# Patient Record
Sex: Male | Born: 1995 | Race: White | Hispanic: No | Marital: Married | Smoking: Current some day smoker
Health system: Southern US, Community
[De-identification: ages and names within clinical notes are randomized; demographics above are authoritative.]

## PROBLEM LIST (undated history)

## (undated) DIAGNOSIS — R519 Headache, unspecified: Secondary | ICD-10-CM

## (undated) DIAGNOSIS — R51 Headache: Secondary | ICD-10-CM

## (undated) DIAGNOSIS — G44009 Cluster headache syndrome, unspecified, not intractable: Secondary | ICD-10-CM

## (undated) DIAGNOSIS — J351 Hypertrophy of tonsils: Secondary | ICD-10-CM

## (undated) DIAGNOSIS — Z87442 Personal history of urinary calculi: Secondary | ICD-10-CM

## (undated) DIAGNOSIS — J3501 Chronic tonsillitis: Secondary | ICD-10-CM

## (undated) DIAGNOSIS — K219 Gastro-esophageal reflux disease without esophagitis: Secondary | ICD-10-CM

## (undated) DIAGNOSIS — F909 Attention-deficit hyperactivity disorder, unspecified type: Secondary | ICD-10-CM

---

## 2014-03-08 ENCOUNTER — Ambulatory Visit: Payer: Self-pay | Admitting: Medical

## 2014-03-08 LAB — RAPID STREP-A WITH REFLX: MICRO TEXT REPORT: NEGATIVE

## 2014-03-11 LAB — BETA STREP CULTURE(ARMC)

## 2015-02-07 ENCOUNTER — Ambulatory Visit: Payer: Self-pay | Admitting: Unknown Physician Specialty

## 2015-06-18 NOTE — Discharge Instructions (Signed)
T & A INSTRUCTION SHEET - [facility] °[facility] ° °CREIGHTON VAUGHT, MD °PAUL H. JUENGEL, MD  °P. SCOTT BENNETT °CHAPMAN MCQUEEN, MD ° °[address] [place], [place] [address] TEL. [phone] °[address] [place] [address] [phone] ° °INFORMATION SHEET FOR A TONSILLECTOMY AND ADENDOIDECTOMY ° °About Your Tonsils and Adenoids ° The tonsils and adenoids are normal body tissues that are part of our immune system.  They normally help to protect us against diseases that may enter our mouth and nose.  However, sometimes the tonsils and/or adenoids become too large and obstruct our breathing, especially at night. °  ° If either of these things happen it helps to remove the tonsils and adenoids in order to become healthier. The operation to remove the tonsils and adenoids is called a tonsillectomy and adenoidectomy. ° °The Location of Your Tonsils and Adenoids ° The tonsils are located in the back of the throat on both side and sit in a cradle of muscles. The adenoids are located in the roof of the mouth, behind the nose, and closely associated with the opening of the Eustachian tube to the ear. ° °Surgery on Tonsils and Adenoids ° A tonsillectomy and adenoidectomy is a short operation which takes about thirty minutes.  This includes being put to sleep and being awakened.  Tonsillectomies and adenoidectomies are performed at [facility] and may require observation period in the recovery room prior to going home. ° °Following the Operation for a Tonsillectomy ° A cautery machine is used to control bleeding.  Bleeding from a tonsillectomy and adenoidectomy is minimal and postoperatively the risk of bleeding is approximately four percent, although this rarely life threatening. ° ° ° °After your tonsillectomy and adenoidectomy post-op care at home: ° °1. Our patients are able to go home the same day.  You may be given prescriptions for pain  medications and antibiotics, if indicated. °2. It is extremely important to remember that fluid intake is of utmost importance after a tonsillectomy.  The amount that you drink must be maintained in the postoperative period.  A good indication of whether a child is getting enough fluid is whether his/her urine output is constant.  As long as children are urinating or wetting their diaper every 6 - 8 hours this is usually enough fluid intake.   °3. Although rare, this is a risk of some bleeding in the first ten days after surgery.  This is usually occurs between day five and nine postoperatively.  This risk of bleeding is approximately four percent.  If you or your child should have any bleeding you should remain calm and notify our office or go directly to the Emergency Room at [facility] where they will contact us. Our doctors are available seven days a week for notification.  We recommend sitting up quietly in a chair, place an ice pack on the front of the neck and spitting out the blood gently until we are able to contact you.  Adults should gargle gently with ice water and this may help stop the bleeding.  If the bleeding does not stop after a short time, i.e. 10 to 15 minutes, or seems to be increasing again, please contact us or go to the hospital.   °4. It is common for the pain to be worse at 5 - 7 days postoperatively.  This occurs because the “scab” is peeling off and the mucous membrane (skin of   the throat) is growing back where the tonsils were.   °5. It is common for a low-grade fever, less than 102, during the first week after a tonsillectomy and adenoidectomy.  It is usually due to not drinking enough liquids, and we suggest your use liquid Tylenol or the pain medicine with Tylenol prescribed in order to keep your temperature below 102.  Please follow the directions on the back of the bottle. °6. Do not take aspirin or any products that contain aspirin such as Bufferin, Anacin,  Ecotrin, aspirin gum, Goodies, BC headache powders, etc., after a T&A because it can promote bleeding.  Please check with our office before administering any other medication that may been prescribed by other doctors during the two week post-operative period. °7. If you happen to look in the mirror or into your child’s mouth you will see white/gray patches on the back of the throat.  This is what a scab looks like in the mouth and is normal after having a T&A.  It will disappear once the tonsil area heals completely. However, it may cause a noticeable odor, and this too will disappear with time.  Warm salt water gargles may be used to keep the throat clean and promote healing.   °8. You or your child may experience ear pain after having a T&A.  This is called referred pain and comes from the throat, but it is felt in the ears.  Ear pain is quite common and expected.  It will usually go away after ten days.  There is usually nothing wrong with the ears, and it is primarily due to the healing area stimulating the nerve to the ear that runs along the side of the throat.  Use either the prescribed pain medicine or Tylenol as needed.  °The throat tissues after a tonsillectomy are obviously sensitive.  Smoking around children who have had a tonsillectomy significantly increases the risk of bleeding.  DO NOT SMOKE!  ° °General Anesthesia, Care After °Refer to this sheet in the next few weeks. These instructions provide you with information on caring for yourself after your procedure. Your health care provider may also give you more specific instructions. Your treatment has been planned according to current medical practices, but problems sometimes occur. Call your health care provider if you have any problems or questions after your procedure. °WHAT TO EXPECT AFTER THE PROCEDURE °After the procedure, it is typical to experience: °· Sleepiness. °· Nausea and vomiting. °HOME CARE INSTRUCTIONS °· For the first 24 hours after  general anesthesia: °¨ Have a responsible person with you. °¨ Do not drive a car. If you are alone, do not take public transportation. °¨ Do not drink alcohol. °¨ Do not take medicine that has not been prescribed by your health care provider. °¨ Do not sign important papers or make important decisions. °¨ You may resume a normal diet and activities as directed by your health care provider. °· Change bandages (dressings) as directed. °· If you have questions or problems that seem related to general anesthesia, call the hospital and ask for the anesthetist or anesthesiologist on call. °SEEK MEDICAL CARE IF: °· You have nausea and vomiting that continue the day after anesthesia. °· You develop a rash. °SEEK IMMEDIATE MEDICAL CARE IF:  °· You have difficulty breathing. °· You have chest pain. °· You have any allergic problems. °Document Released: 02/28/2001 Document Revised: 11/27/2013 Document Reviewed: 06/07/2013 °ExitCare® Patient Information ©2015 [employer]. This information is not intended to replace   advice given to you by your health care provider. Make sure you discuss any questions you have with your health care provider. ° °

## 2015-06-20 ENCOUNTER — Ambulatory Visit
Admission: RE | Admit: 2015-06-20 | Discharge: 2015-06-20 | Disposition: A | Discharge status: Home / Self Care | Payer: BLUE CROSS/BLUE SHIELD | Source: Ambulatory Visit | Attending: Unknown Physician Specialty | Admitting: Unknown Physician Specialty

## 2015-06-20 ENCOUNTER — Encounter
Admission: RE | Disposition: A | Discharge status: Home / Self Care | Payer: Self-pay | Source: Ambulatory Visit | Attending: Unknown Physician Specialty

## 2015-06-20 ENCOUNTER — Ambulatory Visit: Payer: BLUE CROSS/BLUE SHIELD | Admitting: Anesthesiology

## 2015-06-20 ENCOUNTER — Encounter: Payer: Self-pay | Admitting: *Deleted

## 2015-06-20 DIAGNOSIS — Z841 Family history of disorders of kidney and ureter: Secondary | ICD-10-CM | POA: Insufficient documentation

## 2015-06-20 DIAGNOSIS — J351 Hypertrophy of tonsils: Secondary | ICD-10-CM | POA: Diagnosis present

## 2015-06-20 DIAGNOSIS — Z8489 Family history of other specified conditions: Secondary | ICD-10-CM | POA: Insufficient documentation

## 2015-06-20 DIAGNOSIS — Z8249 Family history of ischemic heart disease and other diseases of the circulatory system: Secondary | ICD-10-CM | POA: Diagnosis not present

## 2015-06-20 DIAGNOSIS — J3501 Chronic tonsillitis: Secondary | ICD-10-CM | POA: Insufficient documentation

## 2015-06-20 DIAGNOSIS — F17200 Nicotine dependence, unspecified, uncomplicated: Secondary | ICD-10-CM | POA: Insufficient documentation

## 2015-06-20 HISTORY — DX: Chronic tonsillitis: J35.01

## 2015-06-20 HISTORY — DX: Headache, unspecified: R51.9

## 2015-06-20 HISTORY — DX: Hypertrophy of tonsils: J35.1

## 2015-06-20 HISTORY — DX: Cluster headache syndrome, unspecified, not intractable: G44.009

## 2015-06-20 HISTORY — PX: TONSILLECTOMY AND ADENOIDECTOMY: SHX28

## 2015-06-20 HISTORY — DX: Headache: R51

## 2015-06-20 SURGERY — TONSILLECTOMY AND ADENOIDECTOMY
Anesthesia: General | Wound class: Clean Contaminated

## 2015-06-20 MED ORDER — PROPOFOL 10 MG/ML IV BOLUS
INTRAVENOUS | Status: DC | PRN
  Administered 2015-06-20: 200 mg via INTRAVENOUS

## 2015-06-20 MED ORDER — DEXAMETHASONE SODIUM PHOSPHATE 4 MG/ML IJ SOLN
INTRAMUSCULAR | Status: DC | PRN
  Administered 2015-06-20: 4 mg via INTRAVENOUS

## 2015-06-20 MED ORDER — ONDANSETRON HCL 4 MG/2ML IJ SOLN
INTRAMUSCULAR | Status: DC | PRN
  Administered 2015-06-20: 4 mg via INTRAVENOUS

## 2015-06-20 MED ORDER — LIDOCAINE HCL (CARDIAC) 20 MG/ML IV SOLN
INTRAVENOUS | Status: DC | PRN
  Administered 2015-06-20: 10 mg via INTRAVENOUS

## 2015-06-20 MED ORDER — LACTATED RINGERS IV SOLN
INTRAVENOUS | Status: DC
  Administered 2015-06-20: 10:00:00 via INTRAVENOUS

## 2015-06-20 MED ORDER — FENTANYL CITRATE (PF) 100 MCG/2ML IJ SOLN
25.0000 ug | INTRAMUSCULAR | Status: DC | PRN
  Administered 2015-06-20 (×2): 25 ug via INTRAVENOUS

## 2015-06-20 MED ORDER — ACETAMINOPHEN 10 MG/ML IV SOLN
1000.0000 mg | Freq: Once | INTRAVENOUS | Status: AC
  Administered 2015-06-20: 1000 mg via INTRAVENOUS

## 2015-06-20 MED ORDER — BUPIVACAINE HCL (PF) 0.5 % IJ SOLN
INTRAMUSCULAR | Status: DC | PRN
  Administered 2015-06-20: 9 mL

## 2015-06-20 MED ORDER — OXYCODONE HCL 5 MG PO TABS: 5.0000 mg | ORAL_TABLET | Freq: Once | ORAL | Status: AC | PRN

## 2015-06-20 MED ORDER — ONDANSETRON HCL 4 MG/2ML IJ SOLN: 4.0000 mg | Freq: Once | INTRAMUSCULAR | Status: DC | PRN

## 2015-06-20 MED ORDER — FENTANYL CITRATE (PF) 100 MCG/2ML IJ SOLN
INTRAMUSCULAR | Status: DC | PRN
  Administered 2015-06-20: 50 ug via INTRAVENOUS
  Administered 2015-06-20: 100 ug via INTRAVENOUS

## 2015-06-20 MED ORDER — GLYCOPYRROLATE 0.2 MG/ML IJ SOLN
INTRAMUSCULAR | Status: DC | PRN
  Administered 2015-06-20: 0.1 mg via INTRAVENOUS

## 2015-06-20 MED ORDER — MIDAZOLAM HCL 5 MG/5ML IJ SOLN
INTRAMUSCULAR | Status: DC | PRN
  Administered 2015-06-20: 2 mg via INTRAVENOUS

## 2015-06-20 MED ORDER — OXYCODONE HCL 5 MG/5ML PO SOLN
5.0000 mg | Freq: Once | ORAL | Status: AC | PRN
  Administered 2015-06-20: 5 mg via ORAL

## 2015-06-20 SURGICAL SUPPLY — 20 items
CANISTER SUCT 1200ML W/VALVE (MISCELLANEOUS) ×2 IMPLANT
CATH RUBBER RED 8F (CATHETERS) ×2 IMPLANT
COAG SUCT 10F 3.5MM HAND CTRL (MISCELLANEOUS) ×2 IMPLANT
DRAPE HEAD BAR (DRAPES) ×2 IMPLANT
ELECT CAUTERY BLADE TIP 2.5 (TIP) ×2
ELECTRODE CAUTERY BLDE TIP 2.5 (TIP) ×1 IMPLANT
GLOVE BIO SURGEON STRL SZ7.5 (GLOVE) ×2 IMPLANT
HANDLE SUCTION POOLE (INSTRUMENTS) ×1 IMPLANT
NEEDLE HYPO 25GX1X1/2 BEV (NEEDLE) ×2 IMPLANT
NS IRRIG 500ML POUR BTL (IV SOLUTION) ×2 IMPLANT
PACK TONSIL/ADENOIDS (PACKS) ×2 IMPLANT
PAD GROUND ADULT SPLIT (MISCELLANEOUS) ×2 IMPLANT
PENCIL ELECTRO HAND CTR (MISCELLANEOUS) ×2 IMPLANT
SOL ANTI-FOG 6CC FOG-OUT (MISCELLANEOUS) ×1 IMPLANT
SOL FOG-OUT ANTI-FOG 6CC (MISCELLANEOUS) ×1
SPONGE TONSIL 7/8 RF SGL LF (GAUZE/BANDAGES/DRESSINGS) ×2 IMPLANT
STRAP BODY AND KNEE 60X3 (MISCELLANEOUS) ×2 IMPLANT
SUCTION POOLE HANDLE (INSTRUMENTS) ×2
SYR 5ML LL (SYRINGE) ×2 IMPLANT
SYRINGE 10CC LL (SYRINGE) IMPLANT

## 2015-06-20 NOTE — H&P (Signed)
  H+P  Reviewed and will be scanned in later. No changes noted. 

## 2015-06-20 NOTE — Anesthesia Procedure Notes (Signed)
Procedure Name: Intubation Date/Time: 06/20/2015 11:02 AM Performed by: Andee PolesBUSH, Jaylee Lantry Pre-anesthesia Checklist: Patient identified, Emergency Drugs available, Suction available, Patient being monitored and Timeout performed Patient Re-evaluated:Patient Re-evaluated prior to inductionOxygen Delivery Method: Circle system utilized Preoxygenation: Pre-oxygenation with 100% oxygen Intubation Type: IV induction Ventilation: Mask ventilation without difficulty Grade View: Grade I Tube type: Oral Rae Tube size: 7.5 mm Number of attempts: 1 Placement Confirmation: ETT inserted through vocal cords under direct vision,  positive ETCO2 and breath sounds checked- equal and bilateral Tube secured with: Tape Dental Injury: Teeth and Oropharynx as per pre-operative assessment

## 2015-06-20 NOTE — Transfer of Care (Signed)
Immediate Anesthesia Transfer of Care Note  Patient: Matthew Bird  Procedure(s) Performed: Procedure(s): TONSILLECTOMY AND ADENOIDECTOMY (N/A)  Patient Location: PACU  Anesthesia Type: General ETT  Level of Consciousness: awake, alert  and patient cooperative  Airway and Oxygen Therapy: Patient Spontanous Breathing and Patient connected to supplemental oxygen  Post-op Assessment: Post-op Vital signs reviewed, Patient's Cardiovascular Status Stable, Respiratory Function Stable, Patent Airway and No signs of Nausea or vomiting  Post-op Vital Signs: Reviewed and stable  Complications: No apparent anesthesia complications

## 2015-06-20 NOTE — Op Note (Signed)
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  SURGEON:  Davina Pokehapman T. Flynn Gwyn, MD  ANESTHESIA:  General endotracheal.  OPERATIVE FINDINGS:  Large tonsils and adenoids.  DESCRIPTION OF THE PROCEDURE:  Matthew Bird was identified in the holding area and taken to the operating room and placed in the supine position.  After general endotracheal anesthesia, the table was turned 45 degrees and the patient was draped in the usual fashion for a tonsillectomy.  A mouth gag was inserted into the oral cavity and examination of the oropharynx showed the uvula was non-bifid.  There was no evidence of submucous cleft to the palate.  There were large tonsils.  A red rubber catheter was placed through the nostril.  Examination of the nasopharynx showed large obstructing adenoids.  Under indirect vision with the mirror, an adenotome was placed in the nasopharynx.  The adenoids were curetted free.  Reinspection with a mirror showed excellent removal of the adenoid.  Nasopharyngeal packs were then placed.  The operation then turned to the tonsillectomy.  Beginning on the left-hand side a tenaculum was used to grasp the tonsil and the Bovie cautery was used to dissect it free from the fossa.  In a similar fashion, the right tonsil was removed.  Meticulous hemostasis was achieved using the Bovie cautery.  With both tonsils removed and no active bleeding, the nasopharyngeal packs were removed.  Suction cautery was then used to cauterize the nasopharyngeal bed to prevent bleeding.  The red rubber catheter was removed with no active bleeding.  0.5% plain Marcaine was used to inject the anterior and posterior tonsillar pillars bilaterally.  A total of 9 mls was used.  The patient tolerated the procedure well and was awakened in the operating room and taken to the recovery room in stable condition.   CULTURES:   None.  SPECIMENS:  Tonsils and adenoids.  ESTIMATED BLOOD LOSS:  Less than 20 ml.  [name] T  06/20/2015  11:20 AM

## 2015-06-20 NOTE — Anesthesia Preprocedure Evaluation (Signed)
Anesthesia Evaluation  Patient identified by MRN, date of birth, ID band  Reviewed: Allergy & Precautions, H&P , NPO status , Patient's Chart, lab work & pertinent test results  Airway Mallampati: I  TM Distance: >3 FB Neck ROM: full    Dental no notable dental hx.    Pulmonary former smoker,    Pulmonary exam normal       Cardiovascular Rhythm:regular Rate:Normal     Neuro/Psych  Headaches,    GI/Hepatic   Endo/Other    Renal/GU      Musculoskeletal   Abdominal   Peds  Hematology   Anesthesia Other Findings   Reproductive/Obstetrics                             Anesthesia Physical Anesthesia Plan  ASA: II  Anesthesia Plan: General ETT   Post-op Pain Management:    Induction:   Airway Management Planned:   Additional Equipment:   Intra-op Plan:   Post-operative Plan:   Informed Consent: I have reviewed the patients History and Physical, chart, labs and discussed the procedure including the risks, benefits and alternatives for the proposed anesthesia with the patient or authorized representative who has indicated his/her understanding and acceptance.     Plan Discussed with: CRNA  Anesthesia Plan Comments:         Anesthesia Quick Evaluation

## 2015-06-20 NOTE — Anesthesia Postprocedure Evaluation (Signed)
  Anesthesia Post-op Note  Patient: Matthew Bird  Procedure(s) Performed: Procedure(s): TONSILLECTOMY AND ADENOIDECTOMY (N/A)  Anesthesia type:General ETT  Patient location: PACU  Post pain: Pain level controlled  Post assessment: Post-op Vital signs reviewed, Patient's Cardiovascular Status Stable, Respiratory Function Stable, Patent Airway and No signs of Nausea or vomiting  Post vital signs: Reviewed and stable  Last Vitals:  Filed Vitals:   06/20/15 1200  BP: 117/80  Pulse: 82  Temp:   Resp: 22    Level of consciousness: awake, alert  and patient cooperative  Complications: No apparent anesthesia complications

## 2015-06-24 LAB — SURGICAL PATHOLOGY

## 2016-12-06 DIAGNOSIS — I499 Cardiac arrhythmia, unspecified: Secondary | ICD-10-CM

## 2016-12-06 HISTORY — DX: Cardiac arrhythmia, unspecified: I49.9

## 2017-04-10 ENCOUNTER — Emergency Department: Payer: BLUE CROSS/BLUE SHIELD

## 2017-04-10 ENCOUNTER — Emergency Department
Admission: EM | Admit: 2017-04-10 | Discharge: 2017-04-11 | Disposition: A | Discharge status: Home / Self Care | Payer: BLUE CROSS/BLUE SHIELD | Attending: Emergency Medicine | Admitting: Emergency Medicine

## 2017-04-10 ENCOUNTER — Encounter: Payer: Self-pay | Admitting: Emergency Medicine

## 2017-04-10 DIAGNOSIS — I471 Supraventricular tachycardia: Secondary | ICD-10-CM | POA: Insufficient documentation

## 2017-04-10 DIAGNOSIS — Z79899 Other long term (current) drug therapy: Secondary | ICD-10-CM | POA: Diagnosis not present

## 2017-04-10 DIAGNOSIS — R000 Tachycardia, unspecified: Secondary | ICD-10-CM | POA: Diagnosis present

## 2017-04-10 DIAGNOSIS — F17290 Nicotine dependence, other tobacco product, uncomplicated: Secondary | ICD-10-CM | POA: Diagnosis not present

## 2017-04-10 LAB — CBC
HEMATOCRIT: 45.6 % (ref 40.0–52.0)
HEMOGLOBIN: 16 g/dL (ref 13.0–18.0)
MCH: 30.2 pg (ref 26.0–34.0)
MCHC: 35 g/dL (ref 32.0–36.0)
MCV: 86.3 fL (ref 80.0–100.0)
Platelets: 318 10*3/uL (ref 150–440)
RBC: 5.29 MIL/uL (ref 4.40–5.90)
RDW: 12.1 % (ref 11.5–14.5)
WBC: 10 10*3/uL (ref 3.8–10.6)

## 2017-04-10 MED ORDER — SODIUM CHLORIDE 0.9 % IV BOLUS (SEPSIS)
1000.0000 mL | Freq: Once | INTRAVENOUS | Status: AC
  Administered 2017-04-10: 1000 mL via INTRAVENOUS

## 2017-04-10 MED ORDER — ADENOSINE 6 MG/2ML IV SOLN
6.0000 mg | Freq: Once | INTRAVENOUS | Status: AC
  Administered 2017-04-10: 6 mg via INTRAVENOUS

## 2017-04-10 NOTE — ED Notes (Signed)
ED Provider at bedside. Patient given straw and asked to blow as forcefully as possible.

## 2017-04-11 LAB — COMPREHENSIVE METABOLIC PANEL
ALT: 20 U/L (ref 17–63)
ANION GAP: 12 (ref 5–15)
AST: 24 U/L (ref 15–41)
Albumin: 5 g/dL (ref 3.5–5.0)
Alkaline Phosphatase: 71 U/L (ref 38–126)
BUN: 16 mg/dL (ref 6–20)
CHLORIDE: 103 mmol/L (ref 101–111)
CO2: 25 mmol/L (ref 22–32)
Calcium: 9.9 mg/dL (ref 8.9–10.3)
Creatinine, Ser: 0.97 mg/dL (ref 0.61–1.24)
GFR calc non Af Amer: >60 mL/min (ref >60)
Glucose, Bld: 109 mg/dL — ABNORMAL HIGH (ref 65–99)
Potassium: 3.1 mmol/L — ABNORMAL LOW (ref 3.5–5.1)
SODIUM: 140 mmol/L (ref 135–145)
Total Bilirubin: 1.3 mg/dL — ABNORMAL HIGH (ref 0.3–1.2)
Total Protein: 7.8 g/dL (ref 6.5–8.1)

## 2017-04-11 LAB — TSH: TSH: 3.865 u[IU]/mL (ref 0.350–4.500)

## 2017-04-11 LAB — TROPONIN I: Troponin I: <0.03 ng/mL (ref <0.03)

## 2017-04-11 LAB — MAGNESIUM: MAGNESIUM: 2 mg/dL (ref 1.7–2.4)

## 2017-04-11 MED ORDER — ONDANSETRON HCL 4 MG/2ML IJ SOLN
4.0000 mg | Freq: Once | INTRAMUSCULAR | Status: AC
  Administered 2017-04-11: 4 mg via INTRAVENOUS
  Filled 2017-04-11: qty 2

## 2017-04-11 MED ORDER — POTASSIUM CHLORIDE CRYS ER 20 MEQ PO TBCR
40.0000 meq | EXTENDED_RELEASE_TABLET | Freq: Once | ORAL | Status: AC
  Administered 2017-04-11: 40 meq via ORAL
  Filled 2017-04-11: qty 2

## 2017-04-11 NOTE — ED Provider Notes (Signed)
[facility] Emergency Department Provider Note   ____________________________________________   First MD Initiated Contact with Patient 04/10/17 2335     (approximate)  I have reviewed the triage vital signs and the nursing notes.   HISTORY  Chief Complaint Tachycardia    HPI Matthew Bird is a 21 y.o. male who comes into the hospital with his heart racing. The patient reports this started around 9 PM. The patient had just eaten dinner and was running from his dad who was trying to grab his phone. He reports that his heart started going pretty fast and his heart rate was pretty high. The patient states that it never came down. He waited some time but did not do anything else for the palpitations. He denies any chest pain, shortness of breath, nausea or vomiting. The patient has never had his heart race like this in the past. He decided to come in for treatment and evaluation.   Past Medical History:  Diagnosis Date  . Cluster headache syndrome    WHEN HAVING CLUSTER HEADACHES 1-3/DAY  . Headache   . Tonsillar hypertrophy   . Tonsillitis, chronic     There are no active problems to display for this patient.   Past Surgical History:  Procedure Laterality Date  . TONSILLECTOMY AND ADENOIDECTOMY N/A 06/20/2015   Procedure: TONSILLECTOMY AND ADENOIDECTOMY;  Surgeon: Linus Salmons, MD;  Location: [facility];  Service: ENT;  Laterality: N/A;    Prior to Admission medications   Medication Sig Start Date End Date Taking? Authorizing Provider  Dexmethylphenidate HCl 25 MG CP24 Take 1 capsule by mouth daily.   Yes [provider]    Allergies Patient has no known allergies.  No family history on file.  Social History Social History  Substance Use Topics  . Smoking status: Current Some Day Smoker    Packs/day: 0.00    Types: Cigars  . Smokeless tobacco: Never Used  . Alcohol use Yes    Review of  Systems  Constitutional: No fever/chills Eyes: No visual changes. ENT: No sore throat. Cardiovascular: Palpitations Respiratory: Denies shortness of breath. Gastrointestinal: No abdominal pain.  No nausea, no vomiting.  No diarrhea.  No constipation. Genitourinary: Negative for dysuria. Musculoskeletal: Negative for back pain. Skin: Negative for rash. Neurological: Negative for headaches, focal weakness or numbness.   ____________________________________________   PHYSICAL EXAM:  VITAL SIGNS: ED Triage Vitals  Enc Vitals Group     BP 04/10/17 2341 (!) 133/96     Pulse Rate 04/10/17 2341 (!) 151     Resp 04/10/17 2341 17     Temp --      Temp src --      SpO2 04/10/17 2341 100 %     Weight 04/10/17 2331 165 lb (74.8 kg)     Height 04/10/17 2331 5\' 11"  (1.803 m)     Head Circumference --      Peak Flow --      Pain Score --      Pain Loc --      Pain Edu? --      Excl. in GC? --     Constitutional: Alert and oriented. Well appearing and in Mild distress. Eyes: Conjunctivae are normal. PERRL. EOMI. Head: Atraumatic. Nose: No congestion/rhinnorhea. Mouth/Throat: Mucous membranes are moist.  Oropharynx non-erythematous. Cardiovascular: Tachycardia, regular rhythm. Grossly normal heart sounds.  Good peripheral circulation. Respiratory: Normal respiratory effort.  No retractions. Lungs CTAB. Gastrointestinal: Soft and nontender. No distention. Positive  bowel sounds Musculoskeletal: No lower extremity tenderness nor edema.   Neurologic:  Normal speech and language.  Skin:  Skin is warm, dry and intact.  Psychiatric: Mood and affect are normal.   ____________________________________________   LABS (all labs ordered are listed, but only abnormal results are displayed)  Labs Reviewed  COMPREHENSIVE METABOLIC PANEL - Abnormal; Notable for the following:       Result Value   Potassium 3.1 (*)    Glucose, Bld 109 (*)    Total Bilirubin 1.3 (*)    All other components  within normal limits  CBC  TROPONIN I  MAGNESIUM  TSH  TROPONIN I   ____________________________________________  EKG  ED ECG REPORT #1 I, Rebecka Apley, the attending physician, personally viewed and interpreted this ECG.   Date: 04/10/2017  EKG Time: 2332  Rate: 169  Rhythm: SVT  Axis: normal  Intervals:none  ST&T Change: none  ED ECG REPORT #2 I, Rebecka Apley, the attending physician, personally viewed and interpreted this ECG.   Date: 04/10/2017  EKG Time: 2342  Rate: 121  Rhythm: sinus tachycardia  Axis: normal  Intervals:none  ST&T Change: none   ____________________________________________  RADIOLOGY  CXR ____________________________________________   PROCEDURES  Procedure(s) performed: None  Procedures  Critical Care performed: Yes, see critical care note(s)  .CRITICAL CARE Performed by: Lucrezia Europe P   Total critical care time: 30 minutes  Critical care time was exclusive of separately billable procedures and treating other patients.  Critical care was necessary to treat or prevent imminent or life-threatening deterioration.  Critical care was time spent personally by me on the following activities: development of treatment plan with patient and/or surrogate as well as nursing, discussions with consultants, evaluation of patient's response to treatment, examination of patient, obtaining history from patient or surrogate, ordering and performing treatments and interventions, ordering and review of laboratory studies, ordering and review of radiographic studies, pulse oximetry and re-evaluation of patient's condition.  ____________________________________________   INITIAL IMPRESSION / ASSESSMENT AND PLAN / ED COURSE  Pertinent labs & imaging results that were available during my care of the patient were reviewed by me and considered in my medical decision making (see chart for details).  This is a 21 year old who comes  into the hospital today with some tachycardia. The patient appeared to be in SVT. I did give the patient some adenosine 6 mg IV 1 dose and he did convert to sinus tachycardia and then to normal sinus rhythm. We did check the blood work on the patient and he has some mild hypokalemia but the remaining blood work is unremarkable. I will repeat the patient's troponin and if it is negative he will be discharged to follow-up with cardiology.  Clinical Course as of Apr 11 728  Mon Apr 11, 2017  0048 Normal chest. DG Chest 2 View [AW]    Clinical Course User Index [AW] Rebecka Apley, MD   The patient's repeat troponin is negative. I did give him some potassium chloride for his hypokalemia. He will be discharged home to follow-up with cardiology.  ____________________________________________   FINAL CLINICAL IMPRESSION(S) / ED DIAGNOSES  Final diagnoses:  SVT (supraventricular tachycardia) (HCC)      NEW MEDICATIONS STARTED DURING THIS VISIT:  Discharge Medication List as of 04/11/2017  3:25 AM       Note:  This document was prepared using Dragon voice recognition software and may include unintentional dictation errors.    Rebecka Apley,  MD 04/11/17 [id number]

## 2017-04-11 NOTE — ED Notes (Signed)
Nurse assessed pt's nausea and dimmed light and gave mother coffee.

## 2017-04-11 NOTE — Discharge Instructions (Signed)
Please avoid caffeine and drink lots of water to stay hydrated. Please return with any worsened sypmtoms and please follow up with cardiology

## 2017-04-11 NOTE — ED Notes (Addendum)
Pt states he had eaten dinner and then had been running at 9pm and states he felt like his heart was racing and it didn't slow down.

## 2018-07-26 ENCOUNTER — Other Ambulatory Visit: Payer: Self-pay | Admitting: Internal Medicine

## 2018-07-26 DIAGNOSIS — M5412 Radiculopathy, cervical region: Secondary | ICD-10-CM

## 2018-07-26 DIAGNOSIS — M5010 Cervical disc disorder with radiculopathy, unspecified cervical region: Secondary | ICD-10-CM | POA: Diagnosis not present

## 2018-07-26 DIAGNOSIS — Z0000 Encounter for general adult medical examination without abnormal findings: Secondary | ICD-10-CM | POA: Diagnosis not present

## 2018-08-04 ENCOUNTER — Ambulatory Visit: Payer: BLUE CROSS/BLUE SHIELD

## 2018-08-04 ENCOUNTER — Other Ambulatory Visit: Payer: BLUE CROSS/BLUE SHIELD

## 2018-08-29 DIAGNOSIS — M542 Cervicalgia: Secondary | ICD-10-CM | POA: Diagnosis not present

## 2018-10-09 ENCOUNTER — Other Ambulatory Visit: Payer: Self-pay | Admitting: Unknown Physician Specialty

## 2018-10-09 DIAGNOSIS — R51 Headache: Secondary | ICD-10-CM | POA: Diagnosis not present

## 2018-10-09 DIAGNOSIS — R519 Headache, unspecified: Secondary | ICD-10-CM

## 2018-10-09 DIAGNOSIS — G8929 Other chronic pain: Secondary | ICD-10-CM

## 2018-10-27 ENCOUNTER — Ambulatory Visit: Payer: 59

## 2019-08-21 ENCOUNTER — Ambulatory Visit
Admission: RE | Admit: 2019-08-21 | Discharge: 2019-08-21 | Disposition: A | Discharge status: Home / Self Care | Payer: 59 | Source: Ambulatory Visit | Attending: Internal Medicine | Admitting: Internal Medicine

## 2019-08-21 ENCOUNTER — Other Ambulatory Visit: Admission: RE | Admit: 2019-08-21 | Payer: 59 | Source: Ambulatory Visit

## 2019-08-21 ENCOUNTER — Encounter: Payer: Self-pay | Admitting: Anesthesiology

## 2019-08-21 ENCOUNTER — Other Ambulatory Visit: Payer: Self-pay

## 2019-08-21 ENCOUNTER — Encounter
Admission: AD | Disposition: A | Discharge status: Home / Self Care | Payer: Self-pay | Source: Ambulatory Visit | Attending: General Surgery

## 2019-08-21 ENCOUNTER — Ambulatory Visit: Payer: Self-pay | Admitting: General Surgery

## 2019-08-21 ENCOUNTER — Other Ambulatory Visit: Payer: Self-pay | Admitting: Internal Medicine

## 2019-08-21 ENCOUNTER — Observation Stay
Admission: AD | Admit: 2019-08-21 | Discharge: 2019-08-22 | Disposition: A | Discharge status: Home / Self Care | Payer: 59 | Source: Ambulatory Visit | Attending: General Surgery | Admitting: General Surgery

## 2019-08-21 DIAGNOSIS — K3580 Unspecified acute appendicitis: Secondary | ICD-10-CM | POA: Diagnosis present

## 2019-08-21 DIAGNOSIS — Z20828 Contact with and (suspected) exposure to other viral communicable diseases: Secondary | ICD-10-CM | POA: Diagnosis not present

## 2019-08-21 DIAGNOSIS — K3533 Acute appendicitis with perforation and localized peritonitis, with abscess: Secondary | ICD-10-CM | POA: Diagnosis not present

## 2019-08-21 DIAGNOSIS — R1031 Right lower quadrant pain: Secondary | ICD-10-CM | POA: Insufficient documentation

## 2019-08-21 DIAGNOSIS — K353 Acute appendicitis with localized peritonitis, without perforation or gangrene: Secondary | ICD-10-CM | POA: Diagnosis present

## 2019-08-21 DIAGNOSIS — F17290 Nicotine dependence, other tobacco product, uncomplicated: Secondary | ICD-10-CM | POA: Insufficient documentation

## 2019-08-21 DIAGNOSIS — Z23 Encounter for immunization: Secondary | ICD-10-CM | POA: Diagnosis not present

## 2019-08-21 HISTORY — PX: LAPAROSCOPIC APPENDECTOMY: SHX408

## 2019-08-21 LAB — SARS CORONAVIRUS 2 BY RT PCR (HOSPITAL ORDER, PERFORMED IN ~~LOC~~ HOSPITAL LAB): SARS Coronavirus 2: NEGATIVE

## 2019-08-21 LAB — MRSA PCR SCREENING: MRSA by PCR: NEGATIVE

## 2019-08-21 IMAGING — CT CT ABD-PELV W/ CM
2 of 4 series · 14 of 46 positions shown, 16 images · IV contrast (APPLIED)
Comparison: None.

CLINICAL DATA: Right lower quadrant pain

EXAM:
CT ABDOMEN AND PELVIS WITH CONTRAST
TECHNIQUE: Multidetector CT imaging of the abdomen and pelvis was performed
using the standard protocol following bolus administration of
intravenous contrast.
CONTRAST:  100mL OMNIPAQUE IOHEXOL 300 MG/ML  SOLN

[Series 2: routine abd/pel with · axial · 0.70mm/px · z∈[-522,-157]mm · 11 of 89 slices shown, 13 images]
[im 8/89  soft-tissue]
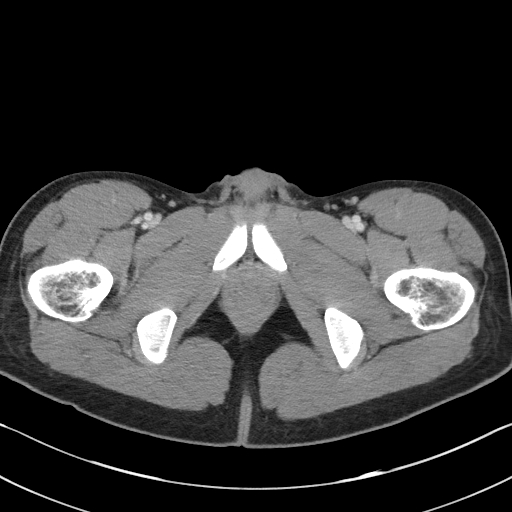
[im 8/89  bone]
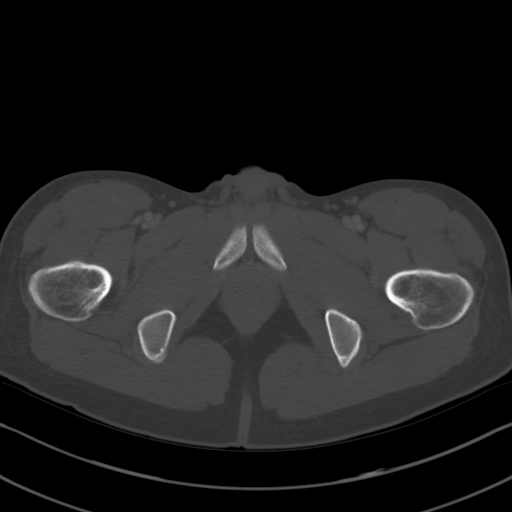
[im 15/89  soft-tissue]
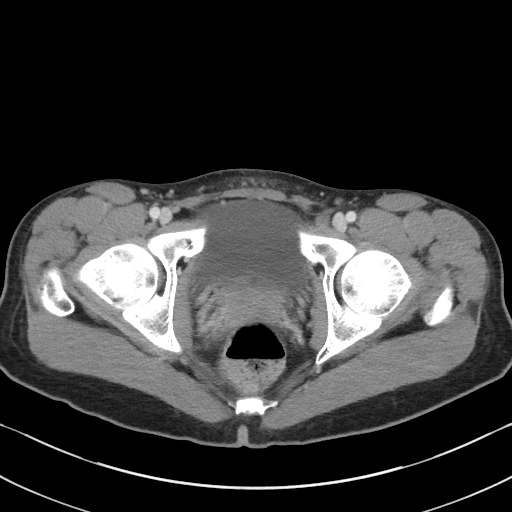
[im 23/89  soft-tissue]
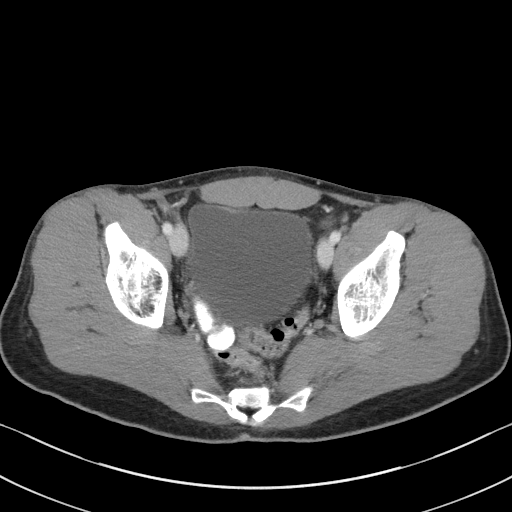
[im 30/89  soft-tissue]
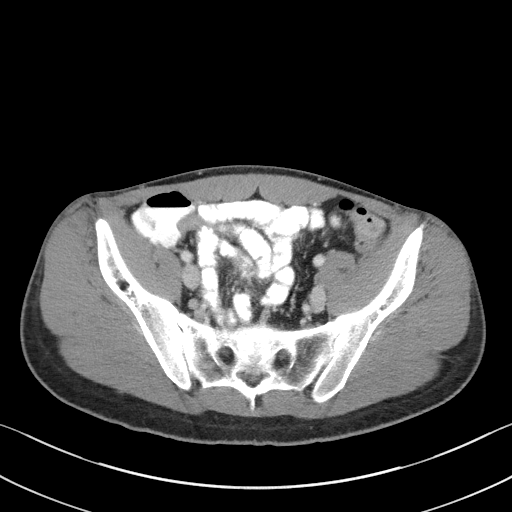
[im 37/89  soft-tissue]
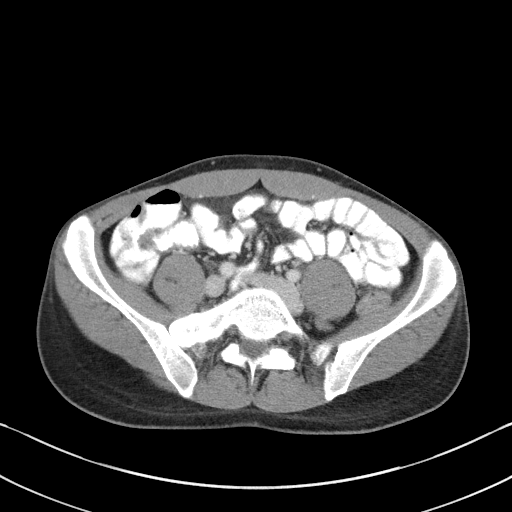
[im 45/89  soft-tissue]
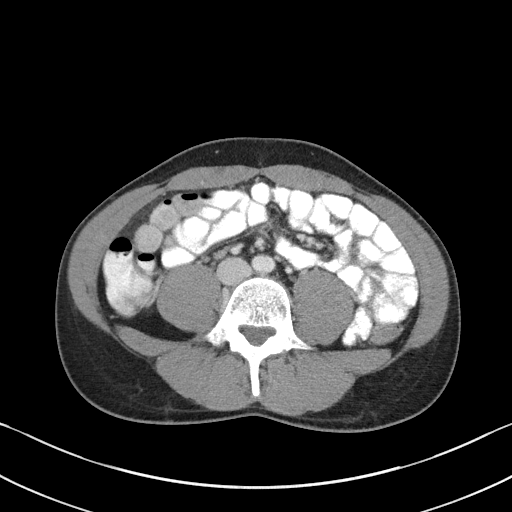
[im 52/89  soft-tissue]
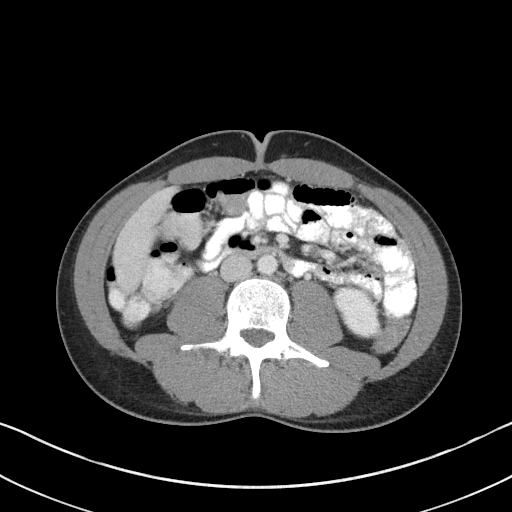
[im 59/89  soft-tissue]
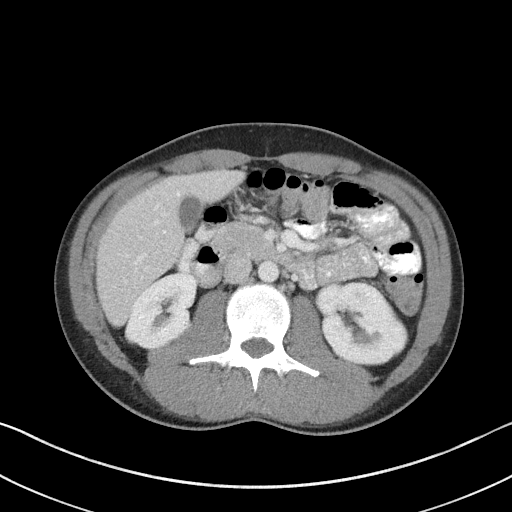
[im 67/89  soft-tissue]
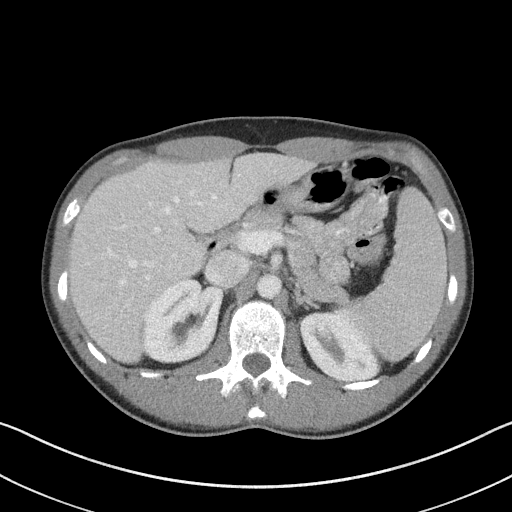
[im 67/89  bone]
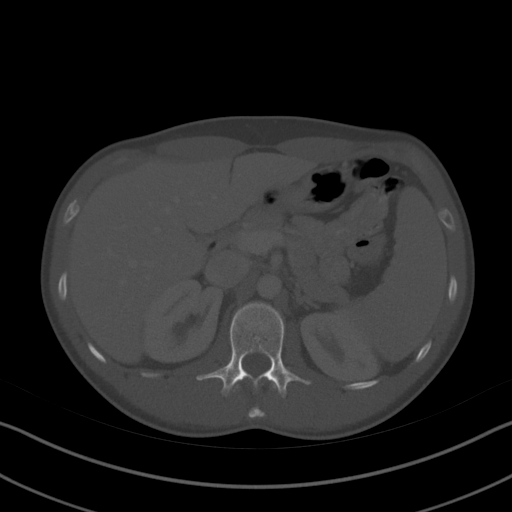
[im 74/89  soft-tissue]
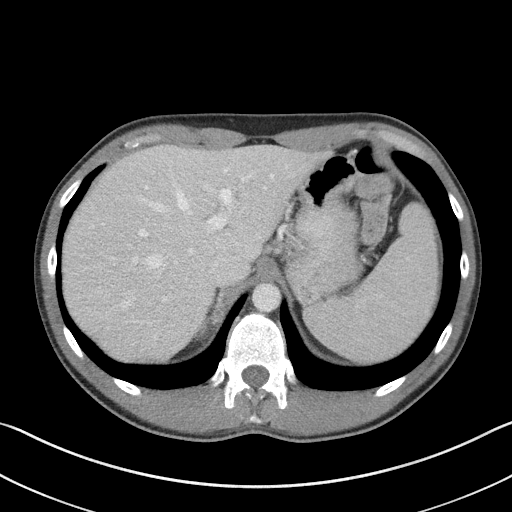
[im 81/89  soft-tissue]
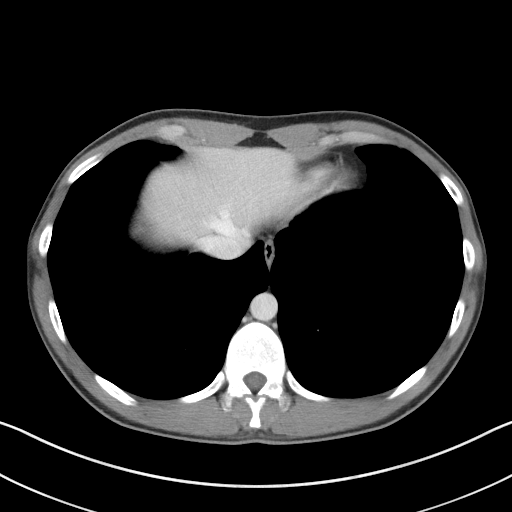

[Series 5: coronal st · coronal · 0.66mm/px · 3 of 81 slices shown]
[im 27/81  soft-tissue]
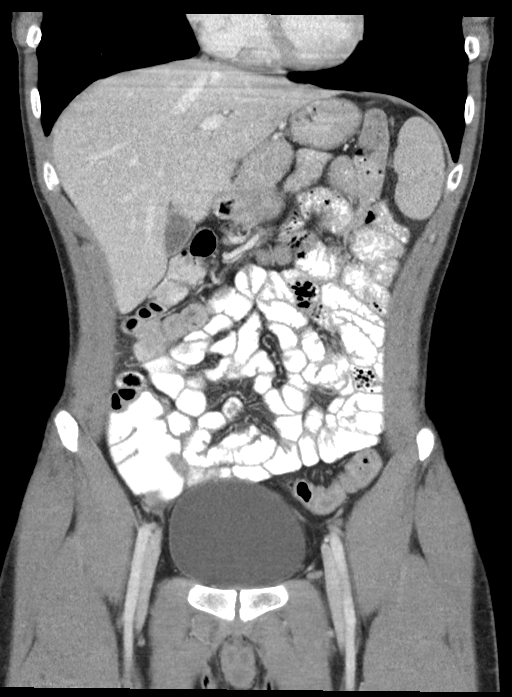
[im 36/81  soft-tissue]
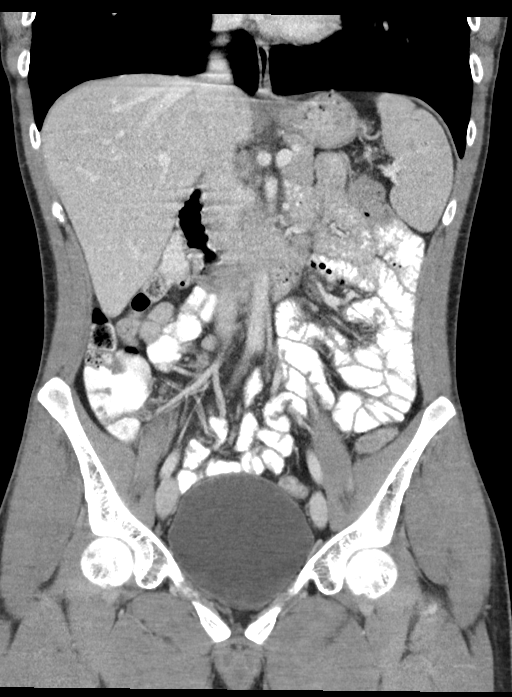
[im 45/81  soft-tissue]
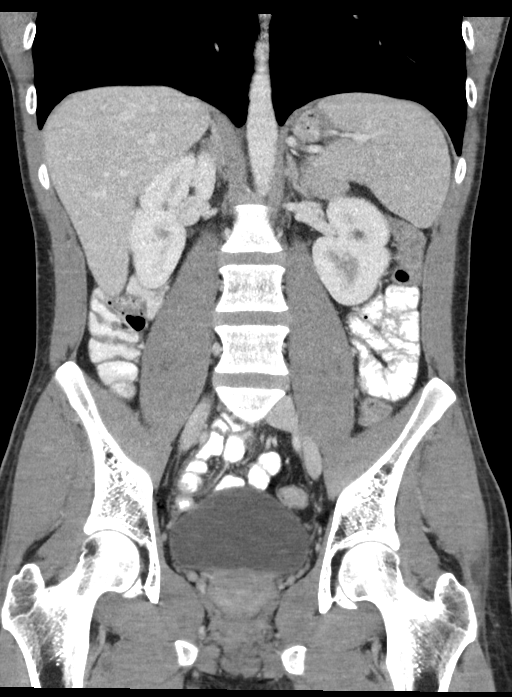

[14 of 46 positions shown; findings below may reference images not displayed]

FINDINGS: Lower chest: Lung bases are clear.

Hepatobiliary: No focal liver lesions are apparent. Gallbladder wall
is not appreciably thickened. There is no biliary duct dilatation.

Pancreas: There is no pancreatic mass or inflammatory focus.

Spleen: Spleen measures 12.8 x 10.5 x 5.9 cm with a measured splenic
volume of 489 cubic cm. No focal splenic lesions are evident.

Adrenals/Urinary Tract: Adrenals bilaterally appear unremarkable.
There is a parapelvic cyst in the left collecting system region
inferiorly measuring 1.6 x 1.5 cm. There is a cyst in the periphery
of the mid right kidney measuring 0.7 x 0.7 cm. A cyst in the
periphery of the lower pole left kidney measures 0.6 x 0.5 cm. A
parapelvic cyst in the upper pole the right kidney measures 1.10.8
cm. A cyst in the medial mid right kidney measures 1.0 x 0.8 cm.
There is no appreciable hydronephrosis on either side. There is a 2
mm calculus in the lower pole left kidney. There is no evident
ureteral calculus on either side. Urinary bladder is midline with
wall thickness within normal limits.

Stomach/Bowel: There are sigmoid diverticula without diverticulitis.
There is no appreciable bowel wall or mesenteric thickening. The
terminal ileum appears unremarkable. No bowel obstruction. No
evident free air or portal venous air.

Vascular/Lymphatic: No abdominal aortic aneurysm. No vascular
lesions are evident. There is no adenopathy in the abdomen or
pelvis.

Reproductive: Prostate and seminal vesicles appear normal in size
and contour. No evident pelvic mass.

Other: Appendix measures 1.1 cm in maximal diameter. There is subtle
periappendiceal soft tissue stranding. There is no periappendiceal
fluid or abscess. No appendicolith. No perforation evident.

No abscess or ascites is evident in the abdomen or pelvis.

Musculoskeletal: There are no blastic or lytic bone lesions. There
is an assimilation joint on the right at L5 an anatomic variant.
There is no intramuscular or abdominal wall lesion.
IMPRESSION: 1. Prominent appendix with subtle periappendiceal soft tissue
stranding, felt to represent a degree of appendicitis.

Appendix: Location: Appendix arises inferiorly to the cecum in the
mid to lower pelvis on the right. The appendix crosses laterally
slightly inferior to the cecum.

Diameter: 11 mm

Appendicolith: None

Mucosal hyper-enhancement: None

Extraluminal gas: None

Periappendiceal collection: No fluid. No abscess. Slight
periappendiceal soft tissue stranding.

2. No bowel obstruction. There are occasional sigmoid diverticula
without diverticulitis. No abscess in the abdomen or pelvis.

3. 2 mm nonobstructing calculus lower pole left kidney. No
hydronephrosis or ureteral calculus on either side. Urinary bladder
wall thickness normal.

4.  Prominent spleen of uncertain etiology.

Critical Value/emergent results were called by telephone at the time
of interpretation on [DATE] at [DATE] to KIRKWOOD ,
who verbally acknowledged these results.

## 2019-08-21 SURGERY — APPENDECTOMY, LAPAROSCOPIC
Anesthesia: General | Site: Abdomen

## 2019-08-21 MED ORDER — IOHEXOL 300 MG/ML  SOLN
100.0000 mL | Freq: Once | INTRAMUSCULAR | Status: AC | PRN
  Administered 2019-08-21: 100 mL via INTRAVENOUS

## 2019-08-21 MED ORDER — PIPERACILLIN-TAZOBACTAM 3.375 G IVPB
3.3750 g | Freq: Three times a day (TID) | INTRAVENOUS | Status: DC
  Administered 2019-08-21 – 2019-08-22 (×3): 3.375 g via INTRAVENOUS
  Filled 2019-08-21 (×3): qty 50

## 2019-08-21 MED ORDER — CEFAZOLIN SODIUM-DEXTROSE 2-4 GM/100ML-% IV SOLN
2.0000 g | INTRAVENOUS | Status: AC
  Administered 2019-08-22: 2 g via INTRAVENOUS
  Filled 2019-08-21: qty 100

## 2019-08-21 MED ORDER — MIDAZOLAM HCL 2 MG/2ML IJ SOLN
INTRAMUSCULAR | Status: AC
  Filled 2019-08-21: qty 2

## 2019-08-21 MED ORDER — PROPOFOL 10 MG/ML IV BOLUS
INTRAVENOUS | Status: AC
  Filled 2019-08-21: qty 20

## 2019-08-21 MED ORDER — KETOROLAC TROMETHAMINE 30 MG/ML IJ SOLN
30.0000 mg | Freq: Four times a day (QID) | INTRAMUSCULAR | Status: DC
  Administered 2019-08-21 – 2019-08-22 (×2): 30 mg via INTRAVENOUS
  Filled 2019-08-21 (×2): qty 1

## 2019-08-21 MED ORDER — ONDANSETRON 4 MG PO TBDP: 4.0000 mg | ORAL_TABLET | Freq: Four times a day (QID) | ORAL | Status: DC | PRN

## 2019-08-21 MED ORDER — ACETAMINOPHEN 500 MG PO TABS
1000.0000 mg | ORAL_TABLET | Freq: Four times a day (QID) | ORAL | Status: DC
  Administered 2019-08-22: 1000 mg via ORAL
  Filled 2019-08-21: qty 2

## 2019-08-21 MED ORDER — SODIUM CHLORIDE 0.9 % IV SOLN: INTRAVENOUS | Status: DC | PRN

## 2019-08-21 MED ORDER — CEFAZOLIN SODIUM-DEXTROSE 2-4 GM/100ML-% IV SOLN
2.0000 g | INTRAVENOUS | Status: DC
  Filled 2019-08-21: qty 100

## 2019-08-21 MED ORDER — ONDANSETRON HCL 4 MG/2ML IJ SOLN: 4.0000 mg | Freq: Four times a day (QID) | INTRAMUSCULAR | Status: DC | PRN

## 2019-08-21 MED ORDER — FENTANYL CITRATE (PF) 250 MCG/5ML IJ SOLN
INTRAMUSCULAR | Status: AC
  Filled 2019-08-21: qty 5

## 2019-08-21 MED ORDER — SODIUM CHLORIDE 0.9 % IV SOLN
INTRAVENOUS | Status: DC
  Administered 2019-08-21 – 2019-08-22 (×2): via INTRAVENOUS

## 2019-08-21 MED ORDER — KETOROLAC TROMETHAMINE 30 MG/ML IJ SOLN: 30.0000 mg | Freq: Four times a day (QID) | INTRAMUSCULAR | Status: DC | PRN

## 2019-08-21 MED ORDER — ENOXAPARIN SODIUM 40 MG/0.4ML ~~LOC~~ SOLN: 40.0000 mg | SUBCUTANEOUS | Status: DC

## 2019-08-21 MED ORDER — INFLUENZA VAC SPLIT QUAD 0.5 ML IM SUSY
0.5000 mL | PREFILLED_SYRINGE | INTRAMUSCULAR | Status: AC
  Administered 2019-08-22: 0.5 mL via INTRAMUSCULAR
  Filled 2019-08-21: qty 0.5

## 2019-08-21 SURGICAL SUPPLY — 44 items
APPLIER CLIP LOGIC TI 5 (MISCELLANEOUS) ×1 IMPLANT
BLADE SURG SZ11 CARB STEEL (BLADE) ×2 IMPLANT
CANISTER SUCT 1200ML W/VALVE (MISCELLANEOUS) ×2 IMPLANT
CHLORAPREP W/TINT 26 (MISCELLANEOUS) ×2 IMPLANT
COVER WAND RF STERILE (DRAPES) ×1 IMPLANT
CUTTER FLEX LINEAR 45M (STAPLE) ×2 IMPLANT
DERMABOND ADVANCED (GAUZE/BANDAGES/DRESSINGS) ×1
DERMABOND ADVANCED .7 DNX12 (GAUZE/BANDAGES/DRESSINGS) ×1 IMPLANT
ELECT REM PT RETURN 9FT ADLT (ELECTROSURGICAL) ×2
ELECTRODE REM PT RTRN 9FT ADLT (ELECTROSURGICAL) ×1 IMPLANT
GLOVE BIO SURGEON STRL SZ 6.5 (GLOVE) ×2 IMPLANT
GLOVE BIO SURGEON STRL SZ7 (GLOVE) ×1 IMPLANT
GLOVE BIOGEL PI IND STRL 6.5 (GLOVE) ×1 IMPLANT
GLOVE BIOGEL PI IND STRL 7.0 (GLOVE) IMPLANT
GLOVE BIOGEL PI INDICATOR 6.5 (GLOVE) ×1
GLOVE BIOGEL PI INDICATOR 7.0 (GLOVE) ×1
GOWN STRL REUS W/ TWL LRG LVL3 (GOWN DISPOSABLE) ×3 IMPLANT
GOWN STRL REUS W/TWL LRG LVL3 (GOWN DISPOSABLE) ×2
GRASPER SUT TROCAR 14GX15 (MISCELLANEOUS) ×2 IMPLANT
HANDLE YANKAUER SUCT BULB TIP (MISCELLANEOUS) ×2 IMPLANT
IRRIGATION STRYKERFLOW (MISCELLANEOUS) IMPLANT
IRRIGATOR STRYKERFLOW (MISCELLANEOUS) ×2
IV NS 1000ML (IV SOLUTION) ×1
IV NS 1000ML BAXH (IV SOLUTION) ×1 IMPLANT
KIT TURNOVER KIT A (KITS) ×2 IMPLANT
LIGASURE LAP MARYLAND 5MM 37CM (ELECTROSURGICAL) ×2 IMPLANT
NEEDLE HYPO 22GX1.5 SAFETY (NEEDLE) ×2 IMPLANT
NEEDLE VERESS 14GA 120MM (NEEDLE) ×2 IMPLANT
NS IRRIG 500ML POUR BTL (IV SOLUTION) ×2 IMPLANT
PACK LAP CHOLECYSTECTOMY (MISCELLANEOUS) ×2 IMPLANT
POUCH ENDO CATCH 10MM SPEC (MISCELLANEOUS) ×2 IMPLANT
RELOAD 45 VASCULAR/THIN (ENDOMECHANICALS) IMPLANT
RELOAD STAPLE 45 2.5 WHT GRN (ENDOMECHANICALS) IMPLANT
RELOAD STAPLE 45 3.5 BLU ETS (ENDOMECHANICALS) ×1 IMPLANT
RELOAD STAPLE TA45 3.5 REG BLU (ENDOMECHANICALS) ×2 IMPLANT
SCISSORS METZENBAUM CVD 33 (INSTRUMENTS) ×2 IMPLANT
SET TUBE SMOKE EVAC HIGH FLOW (TUBING) ×2 IMPLANT
SLEEVE ENDOPATH XCEL 5M (ENDOMECHANICALS) ×2 IMPLANT
SPONGE GAUZE 2X2 8PLY STRL LF (GAUZE/BANDAGES/DRESSINGS) ×1 IMPLANT
SUT MNCRL AB 4-0 PS2 18 (SUTURE) ×1 IMPLANT
SUT VICRYL PLUS ABS 0 54 (SUTURE) ×2 IMPLANT
TRAY FOLEY MTR SLVR 16FR STAT (SET/KITS/TRAYS/PACK) ×2 IMPLANT
TROCAR XCEL 12X100 BLDLESS (ENDOMECHANICALS) ×2 IMPLANT
TROCAR XCEL NON-BLD 5MMX100MML (ENDOMECHANICALS) ×2 IMPLANT

## 2019-08-21 NOTE — H&P (Deleted)
  The note originally documented on this encounter has been moved the the encounter in which it belongs.  

## 2019-08-21 NOTE — H&P (Signed)
SURGICAL CONSULTATION NOTE   HISTORY OF PRESENT ILLNESS (HPI):  23 y.o. male presented to [facility] clinic for evaluation of abdominal pain since 1 AM this morning. Patient reports pain started on the epigastric area.  Pain then radiated to the right upper quadrant.  Pain now localized in the right lower quadrant.  He thought that it was GERD or gas but the pain has been constant.  He went to see his primary care physician who suspected appendicitis and CT scan was ordered.  CT scan shows dilation of the stranding of the appendix consistent with appendicitis.  I personally evaluated the images.  There is no free air or free fluid.  Patient had labs done showing leukocytosis.  There is no alleviating or aggravating factor.  Denies fever chills.  Surgery is consulted by Dr. Sabra Heck in this context for evaluation and management of acute appendicitis.  PAST MEDICAL HISTORY (PMH):  Past Medical History:  Diagnosis Date  . Cluster headache syndrome    WHEN HAVING CLUSTER HEADACHES 1-3/DAY  . Headache   . Tonsillar hypertrophy   . Tonsillitis, chronic      PAST SURGICAL HISTORY ([place]):  Past Surgical History:  Procedure Laterality Date  . TONSILLECTOMY AND ADENOIDECTOMY N/A 06/20/2015   Procedure: TONSILLECTOMY AND ADENOIDECTOMY;  Surgeon: Beverly Gust, MD;  Location: [place];  Service: ENT;  Laterality: N/A;     MEDICATIONS:  Prior to Admission medications   Medication Sig Start Date End Date Taking? Authorizing Provider  Dexmethylphenidate HCl 25 MG CP24 Take 1 capsule by mouth daily.    [provider]     ALLERGIES:  No Known Allergies   SOCIAL HISTORY:  Social History   Socioeconomic History  . Marital status: Single    Spouse name: Not on file  . Number of children: Not on file  . Years of education: Not on file  . Highest education level: Not on file  Occupational History  . Not on file  Social Needs  . Financial resource strain: Not on file  . Food  insecurity    Worry: Not on file    Inability: Not on file  . Transportation needs    Medical: Not on file    Non-medical: Not on file  Tobacco Use  . Smoking status: Current Some Day Smoker    Packs/day: 0.00    Types: Cigars  . Smokeless tobacco: Never Used  Substance and Sexual Activity  . Alcohol use: Yes  . Drug use: No  . Sexual activity: Not on file  Lifestyle  . Physical activity    Days per week: Not on file    Minutes per session: Not on file  . Stress: Not on file  Relationships  . Social Herbalist on phone: Not on file    Gets together: Not on file    Attends religious service: Not on file    Active member of club or organization: Not on file    Attends meetings of clubs or organizations: Not on file    Relationship status: Not on file  . Intimate partner violence    Fear of current or ex partner: Not on file    Emotionally abused: Not on file    Physically abused: Not on file    Forced sexual activity: Not on file  Other Topics Concern  . Not on file  Social History Narrative  . Not on file    The patient currently resides (home / rehab  facility / nursing home): Home The patient normally is (ambulatory / bedbound): Ambulatory   FAMILY HISTORY:  No family history on file.   REVIEW OF SYSTEMS:  Constitutional: denies weight loss, fever, chills, or sweats  Eyes: denies any other vision changes, history of eye injury  ENT: denies sore throat, hearing problems  Respiratory: denies shortness of breath, wheezing  Cardiovascular: denies chest pain, palpitations  Gastrointestinal: positive abdominal pain, nausea and vomiting Genitourinary: denies burning with urination or urinary frequency Musculoskeletal: denies any other joint pains or cramps  Skin: denies any other rashes or skin discolorations  Neurological: denies any other headache, dizziness, weakness  Psychiatric: denies any other depression, anxiety   All other review of systems were  negative   VITAL SIGNS:              08/21/2019                        07/31/2019                                     12/19/2018 BP 144/92 129/84 143/82  Heart Rate 73 89 78  Temp -- 37.2 C (98.9 F) 36.7 C (98 F)  Temp Source -- Oral Oral  Weight 114.8 kg (253 lb) 114.9 kg (253 lb 3.2 oz) 118.4 kg (261 lb)  Height 203.2 cm (6\' 8" ) 203.2 cm (6\' 8" ) --  Pain Score %% Five      PHYSICAL EXAM:  Constitutional:  -- Normal body habitus  -- Awake, alert, and oriented x3  Eyes:  -- Pupils equally round and reactive to light  -- No scleral icterus  Ear, nose, and throat:  -- No jugular venous distension  Pulmonary:  -- No crackles  -- Equal breath sounds bilaterally -- Breathing non-labored at rest Cardiovascular:  -- S1, S2 present  -- No pericardial rubs Gastrointestinal:  -- Abdomen soft, tender in right lower quadrant, non-distended, no guarding or rebound tenderness -- No abdominal masses appreciated, pulsatile or otherwise  Musculoskeletal and Integumentary:  -- Wounds or skin discoloration: None appreciated -- Extremities: B/L UE and LE FROM, hands and feet warm, no edema  Neurologic:  -- Motor function: intact and symmetric -- Sensation: intact and symmetric   Labs:  CBC Latest Ref Rng & Units 04/10/2017  WBC 3.8 - 10.6 K/uL 10.0  Hemoglobin 13.0 - 18.0 g/dL 03.8  Hematocrit 88.2 - 52.0 % 45.6  Platelets 150 - 440 K/uL 318   CMP Latest Ref Rng & Units 04/10/2017  Glucose 65 - 99 mg/dL 800(L)  BUN 6 - 20 mg/dL 16  Creatinine 4.91 - 7.91 mg/dL 5.05  Sodium 697 - 948 mmol/L 140  Potassium 3.5 - 5.1 mmol/L 3.1(L)  Chloride 101 - 111 mmol/L 103  CO2 22 - 32 mmol/L 25  Calcium 8.9 - 10.3 mg/dL 9.9  Total Protein 6.5 - 8.1 g/dL 7.8  Total Bilirubin 0.3 - 1.2 mg/dL 0.1(K)  Alkaline Phos 38 - 126 U/L 71  AST 15 - 41 U/L 24  ALT 17 - 63 U/L 20   Imaging studies:  EXAM: CT ABDOMEN AND PELVIS WITH CONTRAST  TECHNIQUE: Multidetector CT imaging of the abdomen and  pelvis was performed using the standard protocol following bolus administration of intravenous contrast.  CONTRAST:  OMNIPAQUE IOHEXOL 300 MG/ML  SOLN  COMPARISON:  None.  FINDINGS: Lower chest: Lung bases  are clear.  Hepatobiliary: No focal liver lesions are apparent. Gallbladder wall is not appreciably thickened. There is no biliary duct dilatation.  Pancreas: There is no pancreatic mass or inflammatory focus.  Spleen: Spleen measures 12.8 x 10.5 x 5.9 cm with a measured splenic volume of 489 cubic cm. No focal splenic lesions are evident.  Adrenals/Urinary Tract: Adrenals bilaterally appear unremarkable. There is a parapelvic cyst in the left collecting system region inferiorly measuring 1.6 x 1.5 cm. There is a cyst in the periphery of the mid right kidney measuring 0.7 x 0.7 cm. A cyst in the periphery of the lower pole left kidney measures 0.6 x 0.5 cm. A parapelvic cyst in the upper pole the right kidney measures 1.10.8 cm. A cyst in the medial mid right kidney measures 1.0 x 0.8 cm. There is no appreciable hydronephrosis on either side. There is a 2 mm calculus in the lower pole left kidney. There is no evident ureteral calculus on either side. Urinary bladder is midline with wall thickness within normal limits.  Stomach/Bowel: There are sigmoid diverticula without diverticulitis. There is no appreciable bowel wall or mesenteric thickening. The terminal ileum appears unremarkable. No bowel obstruction. No evident free air or portal venous air.  Vascular/Lymphatic: No abdominal aortic aneurysm. No vascular lesions are evident. There is no adenopathy in the abdomen or pelvis.  Reproductive: Prostate and seminal vesicles appear normal in size and contour. No evident pelvic mass.  Other: Appendix measures 1.1 cm in maximal diameter. There is subtle periappendiceal soft tissue stranding. There is no periappendiceal fluid or abscess. No appendicolith.  No perforation evident.  No abscess or ascites is evident in the abdomen or pelvis.  Musculoskeletal: There are no blastic or lytic bone lesions. There is an assimilation joint on the right at L5 an anatomic variant. There is no intramuscular or abdominal wall lesion.  IMPRESSION: 1. Prominent appendix with subtle periappendiceal soft tissue stranding, felt to represent a degree of appendicitis.  Appendix: Location: Appendix arises inferiorly to the cecum in the mid to lower pelvis on the right. The appendix crosses laterally slightly inferior to the cecum.  Diameter: 11 mm  Appendicolith: None  Mucosal hyper-enhancement: None  Extraluminal gas: None  Periappendiceal collection: No fluid. No abscess. Slight periappendiceal soft tissue stranding.  2. No bowel obstruction. There are occasional sigmoid diverticula without diverticulitis. No abscess in the abdomen or pelvis.  3. 2 mm nonobstructing calculus lower pole left kidney. No hydronephrosis or ureteral calculus on either side. Urinary bladder wall thickness normal.  4.  Prominent spleen of uncertain etiology.  Critical Value/emergent results were called by telephone at the time of interpretation on 08/21/2019 at 2:48 pm to [facility]providerMARK MILLER , who verbally acknowledged these results.   Electronically Signed   By: Bretta BangWilliam  Woodruff III M.D.   On: 08/21/2019 14:48  Assessment/Plan:  23 y.o. male with acute appendicitis, complicated by pertinent comorbidities including GERD.  Patient with history, physical exam and images consistent with acute appendicitis. Patient oriented about diagnosis and surgical management as treatment. Patient oriented about goals of surgery and its risk including: bowel injury, infection, abscess, bleeding, leak from cecum, intestinal adhesions, bowel obstruction, fistula, injury to the ureter among others.  Patient understood and agreed to proceed with surgery. Will admit  patient and schedule to OR.   Gae GallopEdgardo Cintrn-Daz, MD

## 2019-08-21 NOTE — Progress Notes (Signed)
Per MD okay for RN to order rapid covid 19 test. Pt will be having surgery tonight.

## 2019-08-22 ENCOUNTER — Observation Stay: Payer: 59 | Admitting: Anesthesiology

## 2019-08-22 ENCOUNTER — Encounter: Payer: Self-pay | Admitting: General Surgery

## 2019-08-22 DIAGNOSIS — K3533 Acute appendicitis with perforation and localized peritonitis, with abscess: Secondary | ICD-10-CM | POA: Diagnosis not present

## 2019-08-22 MED ORDER — KETOROLAC TROMETHAMINE 30 MG/ML IJ SOLN: 30.0000 mg | Freq: Four times a day (QID) | INTRAMUSCULAR | Status: DC | PRN

## 2019-08-22 MED ORDER — MIDAZOLAM HCL 2 MG/2ML IJ SOLN
INTRAMUSCULAR | Status: DC | PRN
  Administered 2019-08-22: 2 mg via INTRAVENOUS

## 2019-08-22 MED ORDER — KETOROLAC TROMETHAMINE 30 MG/ML IJ SOLN: 30.0000 mg | Freq: Four times a day (QID) | INTRAMUSCULAR | Status: DC

## 2019-08-22 MED ORDER — SUCCINYLCHOLINE CHLORIDE 20 MG/ML IJ SOLN
INTRAMUSCULAR | Status: DC | PRN
  Administered 2019-08-22: 80 mg via INTRAVENOUS

## 2019-08-22 MED ORDER — ONDANSETRON HCL 4 MG/2ML IJ SOLN
INTRAMUSCULAR | Status: DC | PRN
  Administered 2019-08-22: 4 mg via INTRAVENOUS

## 2019-08-22 MED ORDER — LACTATED RINGERS IV SOLN
INTRAVENOUS | Status: DC | PRN
  Administered 2019-08-22: 01:00:00 via INTRAVENOUS

## 2019-08-22 MED ORDER — DEXAMETHASONE SODIUM PHOSPHATE 10 MG/ML IJ SOLN
INTRAMUSCULAR | Status: DC | PRN
  Administered 2019-08-22: 10 mg via INTRAVENOUS

## 2019-08-22 MED ORDER — FENTANYL CITRATE (PF) 100 MCG/2ML IJ SOLN: 25.0000 ug | INTRAMUSCULAR | Status: DC | PRN

## 2019-08-22 MED ORDER — KETOROLAC TROMETHAMINE 30 MG/ML IJ SOLN
INTRAMUSCULAR | Status: DC | PRN
  Administered 2019-08-22: 30 mg via INTRAVENOUS

## 2019-08-22 MED ORDER — FENTANYL CITRATE (PF) 100 MCG/2ML IJ SOLN
INTRAMUSCULAR | Status: DC | PRN
  Administered 2019-08-22: 50 ug via INTRAVENOUS
  Administered 2019-08-22 (×2): 100 ug via INTRAVENOUS

## 2019-08-22 MED ORDER — PROPOFOL 10 MG/ML IV BOLUS
INTRAVENOUS | Status: DC | PRN
  Administered 2019-08-22: 160 mg via INTRAVENOUS

## 2019-08-22 MED ORDER — ROCURONIUM BROMIDE 50 MG/5ML IV SOLN
INTRAVENOUS | Status: AC
  Filled 2019-08-22: qty 1

## 2019-08-22 MED ORDER — SEVOFLURANE IN SOLN
RESPIRATORY_TRACT | Status: AC
  Filled 2019-08-22: qty 250

## 2019-08-22 MED ORDER — SUGAMMADEX SODIUM 500 MG/5ML IV SOLN
INTRAVENOUS | Status: DC | PRN
  Administered 2019-08-22: 160 mg via INTRAVENOUS

## 2019-08-22 MED ORDER — SUGAMMADEX SODIUM 200 MG/2ML IV SOLN
INTRAVENOUS | Status: AC
  Filled 2019-08-22: qty 2

## 2019-08-22 MED ORDER — BUPIVACAINE-EPINEPHRINE 0.5% -1:200000 IJ SOLN
INTRAMUSCULAR | Status: DC | PRN
  Administered 2019-08-22: 25 mL

## 2019-08-22 MED ORDER — SUCCINYLCHOLINE CHLORIDE 20 MG/ML IJ SOLN
INTRAMUSCULAR | Status: AC
  Filled 2019-08-22: qty 1

## 2019-08-22 MED ORDER — BUPIVACAINE-EPINEPHRINE (PF) 0.5% -1:200000 IJ SOLN
INTRAMUSCULAR | Status: AC
  Filled 2019-08-22: qty 30

## 2019-08-22 MED ORDER — ONDANSETRON HCL 4 MG/2ML IJ SOLN
INTRAMUSCULAR | Status: AC
  Filled 2019-08-22: qty 2

## 2019-08-22 MED ORDER — HYDROCODONE-ACETAMINOPHEN 5-325 MG PO TABS: 1.0000 | ORAL_TABLET | ORAL | 0 refills | Status: AC | PRN

## 2019-08-22 MED ORDER — LIDOCAINE HCL (CARDIAC) PF 100 MG/5ML IV SOSY
PREFILLED_SYRINGE | INTRAVENOUS | Status: DC | PRN
  Administered 2019-08-22: 100 mg via INTRAVENOUS

## 2019-08-22 MED ORDER — ONDANSETRON HCL 4 MG/2ML IJ SOLN: 4.0000 mg | Freq: Once | INTRAMUSCULAR | Status: DC | PRN

## 2019-08-22 MED ORDER — SODIUM CHLORIDE 0.9 % IV SOLN
INTRAVENOUS | Status: DC | PRN
  Administered 2019-08-22: 250 mL via INTRAVENOUS
  Administered 2019-08-22: 30 mL via INTRAVENOUS

## 2019-08-22 MED ORDER — KETOROLAC TROMETHAMINE 30 MG/ML IJ SOLN
INTRAMUSCULAR | Status: AC
  Filled 2019-08-22: qty 1

## 2019-08-22 NOTE — Transfer of Care (Signed)
Immediate Anesthesia Transfer of Care Note  Patient: Matthew Bird  Procedure(s) Performed: APPENDECTOMY LAPAROSCOPIC (N/A Abdomen)  Patient Location: PACU  Anesthesia Type:General  Level of Consciousness: awake, alert , oriented and patient cooperative  Airway & Oxygen Therapy: Patient Spontanous Breathing and Patient connected to face mask oxygen  Post-op Assessment: Report given to RN and Post -op Vital signs reviewed and stable  Post vital signs: Reviewed and stable  Last Vitals:  Vitals Value Taken Time  BP 132/70 08/22/19 0230  Temp 36.3 C 08/22/19 0230  Pulse 94 08/22/19 0235  Resp 17 08/22/19 0235  SpO2 100 % 08/22/19 0235  Vitals shown include unvalidated device data.  Last Pain:  Vitals:   08/22/19 0230  TempSrc:   PainSc: 0-No pain         Complications: No apparent anesthesia complications

## 2019-08-22 NOTE — Anesthesia Procedure Notes (Signed)
Procedure Name: Intubation Date/Time: 08/22/2019 1:21 AM Performed by: Lendon Colonel, CRNA Pre-anesthesia Checklist: Patient identified, Patient being monitored, Timeout performed, Emergency Drugs available and Suction available Patient Re-evaluated:Patient Re-evaluated prior to induction Oxygen Delivery Method: Circle system utilized Preoxygenation: Pre-oxygenation with 100% oxygen Induction Type: IV induction and Rapid sequence Laryngoscope Size: Mac and 3 Grade View: Grade I Tube type: Oral Tube size: 7.5 mm Number of attempts: 1 Airway Equipment and Method: Stylet Placement Confirmation: ETT inserted through vocal cords under direct vision,  positive ETCO2 and breath sounds checked- equal and bilateral Secured at: 21 cm Tube secured with: Tape Dental Injury: Teeth and Oropharynx as per pre-operative assessment

## 2019-08-22 NOTE — Discharge Instructions (Signed)

## 2019-08-22 NOTE — Anesthesia Postprocedure Evaluation (Signed)
Anesthesia Post Note  Patient: Matthew Bird  Procedure(s) Performed: APPENDECTOMY LAPAROSCOPIC (N/A Abdomen)  Patient location during evaluation: PACU Anesthesia Type: General Level of consciousness: awake and alert Pain management: pain level controlled Vital Signs Assessment: post-procedure vital signs reviewed and stable Respiratory status: spontaneous breathing, nonlabored ventilation, respiratory function stable and patient connected to nasal cannula oxygen Cardiovascular status: blood pressure returned to baseline and stable Postop Assessment: no apparent nausea or vomiting Anesthetic complications: no     Last Vitals:  Vitals:   08/22/19 0359 08/22/19 0514  BP: 107/65 (!) 110/54  Pulse: 74 70  Resp: 16 20  Temp: (!) 36.3 C 36.6 C  SpO2: 98% 98%    Last Pain:  Vitals:   08/22/19 0514  TempSrc: Oral  PainSc:                  Kaley Jutras S

## 2019-08-22 NOTE — Anesthesia Post-op Follow-up Note (Signed)
Anesthesia QCDR form completed.        

## 2019-08-22 NOTE — Progress Notes (Signed)
Discharge education provided. Pt expressed understanding of follow-up appointments, when to call the doctor, and current medication. Packet of discharge information given to patient's mother. IV removed and patient tolerated it well. Pt was wheeled downstairs in a wheelchair by one of our staff.

## 2019-08-22 NOTE — Op Note (Signed)
Preoperative diagnosis: Acute appendicitis.  Postoperative diagnosis: Acute appendicitis  Procedure: Laparoscopic appendectomy.  Anesthesia: GETA  Surgeon: Dr. Windell Moment, MD  Wound Classification: Contaminated  Indications: Patient is a 23 y.o. male  presented with right lower quadrant pain of one day of duration, elevated WBC. Computed tomography scan and physical examination were consistent with acute appendicitis.   Findings: 1. Acutely inflamed appendix 2. No peri-appendiceal abscess or phlegmon 3. Normal anatomy 4. Adequate hemostasis.   Description of procedure: The patient was placed on the operating table in the supine position. General anesthesia was induced. A time-out was completed verifying correct patient, procedure, site, positioning, and implant(s) and/or special equipment prior to beginning this procedure. A Foley catheter and orogastric tubes were placed. The abdomen was prepped and draped in the usual sterile fashion.  An incision was made in a natural skin line above the umbilicus.  The fascia was elevated and the Veress needle inserted. Proper position was confirmed by aspiration and saline meniscus test. The abdomen was insufflated with carbon dioxide to a pressure of 15 mmHg. The patient tolerated insufflation well. A 5-mm optiview trocar was then inserted supraumbilically. The laparoscope was inserted and the abdomen inspected. No injuries from initial trocar placement were noted. Turbid fluid was noted in the right lower quadrant. Under direct visualization, an 12-mm trocar was inserted in the left lower quadrant lateral to the rectus muscle. A 5-mm port was then placed above the symphysis pubis on midline.  Care was taken to avoid injury to the bladder or inferior epigastric vessels. The table was placed in the Trendelenburg position with the right side elevated.  The cecum was gently grasped with an endoscopic graspers and pulled toward (the left upper quadrant).  An atraumatic grasper was then passed through the suprapubic port and omentum was dissected away until the appendix was identified. The appendix was then grasped and elevated. It was noted to be inflamed.  An endoscopic linear cutting stapler was then used to divide and staple the base of the appendix. The mesoappendix was divided with LigaSure.  The appendix was placed in an endoscopic retrieval bag and removed.  The appendiceal stump was then irrigated and hemostasis was assured. Fluid was suctioned and no other pathology was identified.  Secondary trocars were removed under direct vision. No bleeding was noted. The laparoscope was withdrawn and the umbilical trocar removed. The abdomen was allowed to collapse. All trocar sites greater than 5 mm were closed with Vicryl 0. The skin was closed with subcuticular sutures Monocryl 4-0 of and steristrips.  The patient tolerated the procedure well and was taken to the postanesthesia care unit in satisfactory condition.   Specimen: Appendix  Complications: None  Estimated Blood Loss: 3 mL

## 2019-08-22 NOTE — Anesthesia Preprocedure Evaluation (Addendum)
Anesthesia Evaluation  Patient identified by MRN, date of birth, ID band Patient awake    Reviewed: Allergy & Precautions, NPO status , Patient's Chart, lab work & pertinent test results, reviewed documented beta blocker date and time   Airway Mallampati: II  TM Distance: >3 FB     Dental  (+) Chipped   Pulmonary Current Smoker,           Cardiovascular      Neuro/Psych  Headaches,    GI/Hepatic   Endo/Other    Renal/GU      Musculoskeletal   Abdominal   Peds  Hematology   Anesthesia Other Findings   Reproductive/Obstetrics                             Anesthesia Physical Anesthesia Plan  ASA: II  Anesthesia Plan: General   Post-op Pain Management:    Induction: Intravenous  PONV Risk Score and Plan:   Airway Management Planned: Oral ETT  Additional Equipment:   Intra-op Plan:   Post-operative Plan:   Informed Consent: I have reviewed the patients History and Physical, chart, labs and discussed the procedure including the risks, benefits and alternatives for the proposed anesthesia with the patient or authorized representative who has indicated his/her understanding and acceptance.       Plan Discussed with: CRNA  Anesthesia Plan Comments:         Anesthesia Quick Evaluation

## 2019-08-22 NOTE — Discharge Summary (Signed)
  Patient ID: Matthew Bird MRN: [id number] DOB/AGE: [birth year] 23 y.o.  Admit date: 08/21/2019 Discharge date: 08/22/2019   Discharge Diagnoses:  Active Problems:   Appendicitis, acute   Acute appendicitis with localized peritonitis   Procedures: Laparoscopic appendectomy  Hospital Course: Umbilical appendicitis.  He was admitted observation for laparoscopic appendectomy.  He tolerated procedure well.  This morning had breakfast and ambulated.  Pain is controlled.  The wounds are dry and clean.  Patient tolerated diet.  Physical Exam  Constitutional: He is oriented to person, place, and time and well-developed, well-nourished, and in no distress.  Cardiovascular: Normal rate.  Pulmonary/Chest: Effort normal.  Abdominal: Soft. Bowel sounds are normal. He exhibits no distension. There is no abdominal tenderness.  Neurological: He is alert and oriented to person, place, and time.  Skin: Skin is warm.   Consults: None  Disposition: Discharge disposition: 01-Home or Self Care       Discharge Instructions    Diet - low sodium heart healthy   Complete by: As directed    Increase activity slowly   Complete by: As directed      Allergies as of 08/22/2019   No Known Allergies     Medication List    TAKE these medications   Dexmethylphenidate HCl 25 MG Cp24 Take 1 capsule by mouth daily.   HYDROcodone-acetaminophen 5-325 MG tablet Commonly known as: Norco Take 1 tablet by mouth every 4 (four) hours as needed for up to 3 days for moderate pain.      Follow-up Information    Herbert Pun, MD Follow up in 2 week(s).   Specialty: General Surgery Contact information: [address] [place] [address] [phone]

## 2019-08-23 LAB — HIV ANTIBODY (ROUTINE TESTING W REFLEX): HIV Screen 4th Generation wRfx: NONREACTIVE

## 2019-09-07 LAB — SURGICAL PATHOLOGY

## 2019-09-21 ENCOUNTER — Other Ambulatory Visit: Payer: Self-pay | Admitting: Internal Medicine

## 2019-09-21 DIAGNOSIS — M5412 Radiculopathy, cervical region: Secondary | ICD-10-CM

## 2019-10-26 ENCOUNTER — Other Ambulatory Visit: Payer: Self-pay

## 2019-10-26 DIAGNOSIS — Z20822 Contact with and (suspected) exposure to covid-19: Secondary | ICD-10-CM

## 2019-10-29 LAB — NOVEL CORONAVIRUS, NAA: SARS-CoV-2, NAA: NOT DETECTED

## 2019-10-30 ENCOUNTER — Telehealth: Payer: Self-pay

## 2019-10-30 NOTE — Telephone Encounter (Signed)
Patient given negative result and verbalized understanding  

## 2022-12-06 HISTORY — PX: ESOPHAGOGASTRODUODENOSCOPY: SHX1529

## 2023-05-20 ENCOUNTER — Ambulatory Visit: Payer: BC Managed Care – PPO

## 2023-05-20 DIAGNOSIS — K449 Diaphragmatic hernia without obstruction or gangrene: Secondary | ICD-10-CM

## 2023-05-20 DIAGNOSIS — R1013 Epigastric pain: Secondary | ICD-10-CM

## 2023-05-20 DIAGNOSIS — K921 Melena: Secondary | ICD-10-CM

## 2023-05-20 DIAGNOSIS — K219 Gastro-esophageal reflux disease without esophagitis: Secondary | ICD-10-CM

## 2023-12-07 DIAGNOSIS — C801 Malignant (primary) neoplasm, unspecified: Secondary | ICD-10-CM

## 2023-12-07 HISTORY — DX: Malignant (primary) neoplasm, unspecified: C80.1

## 2024-09-24 ENCOUNTER — Other Ambulatory Visit: Payer: Self-pay | Admitting: Internal Medicine

## 2024-09-24 ENCOUNTER — Ambulatory Visit
Admission: RE | Admit: 2024-09-24 | Discharge: 2024-09-24 | Disposition: A | Discharge status: Home / Self Care | Source: Ambulatory Visit | Attending: Internal Medicine | Admitting: Internal Medicine

## 2024-09-24 DIAGNOSIS — R1032 Left lower quadrant pain: Secondary | ICD-10-CM

## 2024-10-29 ENCOUNTER — Other Ambulatory Visit: Payer: Self-pay

## 2024-10-29 ENCOUNTER — Emergency Department (HOSPITAL_COMMUNITY)
Admission: EM | Admit: 2024-10-29 | Discharge: 2024-10-29 | Disposition: A | Discharge status: Home / Self Care | Attending: Emergency Medicine | Admitting: Emergency Medicine

## 2024-10-29 ENCOUNTER — Encounter (HOSPITAL_COMMUNITY): Payer: Self-pay | Admitting: *Deleted

## 2024-10-29 ENCOUNTER — Emergency Department (HOSPITAL_COMMUNITY)

## 2024-10-29 DIAGNOSIS — N200 Calculus of kidney: Secondary | ICD-10-CM | POA: Diagnosis not present

## 2024-10-29 DIAGNOSIS — R1032 Left lower quadrant pain: Secondary | ICD-10-CM | POA: Diagnosis present

## 2024-10-29 LAB — URINALYSIS, ROUTINE W REFLEX MICROSCOPIC
Bilirubin Urine: NEGATIVE
Glucose, UA: NEGATIVE mg/dL
Ketones, ur: 20 mg/dL — AB
Leukocytes,Ua: NEGATIVE
Nitrite: NEGATIVE
Protein, ur: 30 mg/dL — AB
RBC / HPF: >50 RBC/hpf (ref 0–5)
Specific Gravity, Urine: 1.023 (ref 1.005–1.030)
pH: 5 (ref 5.0–8.0)

## 2024-10-29 LAB — COMPREHENSIVE METABOLIC PANEL WITH GFR
ALT: 11 U/L (ref 0–44)
AST: 19 U/L (ref 15–41)
Albumin: 4.5 g/dL (ref 3.5–5.0)
Alkaline Phosphatase: 45 U/L (ref 38–126)
Anion gap: 14 (ref 5–15)
BUN: 15 mg/dL (ref 6–20)
CO2: 22 mmol/L (ref 22–32)
Calcium: 9.4 mg/dL (ref 8.9–10.3)
Chloride: 101 mmol/L (ref 98–111)
Creatinine, Ser: 1.2 mg/dL (ref 0.61–1.24)
GFR, Estimated: >60 mL/min (ref >60)
Glucose, Bld: 106 mg/dL — ABNORMAL HIGH (ref 70–99)
Potassium: 3.8 mmol/L (ref 3.5–5.1)
Sodium: 137 mmol/L (ref 135–145)
Total Bilirubin: 1 mg/dL (ref 0.0–1.2)
Total Protein: 7.1 g/dL (ref 6.5–8.1)

## 2024-10-29 LAB — CBC
HCT: 40.4 % (ref 39.0–52.0)
Hemoglobin: 14.1 g/dL (ref 13.0–17.0)
MCH: 30.3 pg (ref 26.0–34.0)
MCHC: 34.9 g/dL (ref 30.0–36.0)
MCV: 86.9 fL (ref 80.0–100.0)
Platelets: 223 K/uL (ref 150–400)
RBC: 4.65 MIL/uL (ref 4.22–5.81)
RDW: 11.5 % (ref 11.5–15.5)
WBC: 10.4 K/uL (ref 4.0–10.5)
nRBC: 0 % (ref 0.0–0.2)

## 2024-10-29 LAB — LIPASE, BLOOD: Lipase: 34 U/L (ref 11–51)

## 2024-10-29 MED ORDER — KETOROLAC TROMETHAMINE 15 MG/ML IJ SOLN
15.0000 mg | Freq: Once | INTRAMUSCULAR | Status: AC
  Administered 2024-10-29: 15 mg via INTRAVENOUS
  Filled 2024-10-29: qty 1

## 2024-10-29 MED ORDER — OXYCODONE-ACETAMINOPHEN 5-325 MG PO TABS: 1.0000 | ORAL_TABLET | Freq: Four times a day (QID) | ORAL | 0 refills | Status: DC | PRN

## 2024-10-29 MED ORDER — OXYCODONE-ACETAMINOPHEN 5-325 MG PO TABS
1.0000 | ORAL_TABLET | Freq: Once | ORAL | Status: AC
  Administered 2024-10-29: 1 via ORAL
  Filled 2024-10-29: qty 1

## 2024-10-29 MED ORDER — ONDANSETRON 4 MG PO TBDP
8.0000 mg | ORAL_TABLET | Freq: Once | ORAL | Status: AC
  Administered 2024-10-29: 8 mg via ORAL
  Filled 2024-10-29: qty 2

## 2024-10-29 MED ORDER — IOHEXOL 350 MG/ML SOLN
75.0000 mL | Freq: Once | INTRAVENOUS | Status: AC | PRN
  Administered 2024-10-29: 75 mL via INTRAVENOUS

## 2024-10-29 MED ORDER — MAGNESIUM SULFATE 2 GM/50ML IV SOLN
2.0000 g | Freq: Once | INTRAVENOUS | Status: AC
  Administered 2024-10-29: 2 g via INTRAVENOUS
  Filled 2024-10-29: qty 50

## 2024-10-29 MED ORDER — SODIUM CHLORIDE 0.9 % IV BOLUS
1000.0000 mL | Freq: Once | INTRAVENOUS | Status: AC
  Administered 2024-10-29: 1000 mL via INTRAVENOUS

## 2024-10-29 MED ORDER — ONDANSETRON 4 MG PO TBDP: 4.0000 mg | ORAL_TABLET | Freq: Three times a day (TID) | ORAL | 0 refills | Status: AC | PRN

## 2024-10-29 MED ORDER — FENTANYL CITRATE (PF) 50 MCG/ML IJ SOSY: 50.0000 ug | PREFILLED_SYRINGE | Freq: Once | INTRAMUSCULAR | Status: DC

## 2024-10-29 NOTE — Discharge Instructions (Signed)
 It was a pleasure taking part in your care.  As we discussed, you have a 4 mm kidney stone at your ureteropelvic junction causing pain.  Please continue taking tamsulosin once a day as prescribed.  Please take Zofran  every 6 hours as needed for nausea.  Please take 1 tablet of oxycodone  every 6 hours as needed for pain.  Do not drive, operate machinery, mix this medication with alcohol.  Please attempt to push fluids and urinate once an hour at minimum.  Follow-up with urology which I referred you to.  If you develop fevers, and bloody to urinate please return to the ED.

## 2024-10-29 NOTE — ED Provider Notes (Signed)
  EMERGENCY DEPARTMENT AT [facility] Provider Note   CSN: [id number] Arrival date & time: 10/29/24  9943     Patient presents with: Abdominal Pain   Matthew Bird is a 28 y.o. male with history of cluster headache syndrome, chronic tonsillitis, acute appendicitis.  Presents ED complaining of left lower quadrant abdominal pain, hematuria, nausea and vomiting.  States that his symptoms been ongoing for the last 1 month.  He has been seen by his PCP for this issue 2 times in office.  Initially on 10/17 and then subsequently on 11/6.  He has had CT scan of abdomen obtained which showed a nonobstructing left renal calculi measuring 5 mm.  Patient was subsequently seen on 11/6 where he reported that he was doing better overall and denied blood in urine.  Patient reports that this evening he developed this left lower quadrant pain which is different, changed from typical pain.  He reports that it feels as if there is a pressure in his left lower quadrant.  He denies dysuria, denies gross hematuria.  Denies fevers at home.  Endorsing nausea and vomiting today x 2 but reports no nausea after being given Zofran  in triage.  Denies chest pain or shortness of breath.  Denies concern of STI.  Denies history of diverticulitis.   Abdominal Pain Associated symptoms: hematuria, nausea and vomiting        Prior to Admission medications   Medication Sig Start Date End Date Taking? Authorizing Provider  ondansetron  (ZOFRAN -ODT) 4 MG disintegrating tablet Take 1 tablet (4 mg total) by mouth every 8 (eight) hours as needed for nausea or vomiting. 10/29/24  Yes Ruthell Lonni FALCON, PA-C  oxyCODONE -acetaminophen  (PERCOCET/ROXICET) 5-325 MG tablet Take 1 tablet by mouth every 6 (six) hours as needed for severe pain (pain score 7-10). 10/29/24  Yes Ruthell Lonni FALCON, PA-C  Dexmethylphenidate HCl 25 MG CP24 Take 1 capsule by mouth daily.    [provider]    Allergies: Patient has no  known allergies.    Review of Systems  Gastrointestinal:  Positive for abdominal pain, nausea and vomiting.  Genitourinary:  Positive for hematuria.  All other systems reviewed and are negative.   Updated Vital Signs BP 115/65   Pulse 67   Temp (!) 97.5 F (36.4 C) (Oral)   Resp 13   Ht 5' 11 (1.803 m)   Wt 74.8 kg   SpO2 96%   BMI 23.00 kg/m   Physical Exam Vitals and nursing note reviewed.  Constitutional:      General: He is not in acute distress.    Appearance: He is well-developed.  HENT:     Head: Normocephalic and atraumatic.  Eyes:     Conjunctiva/sclera: Conjunctivae normal.  Cardiovascular:     Rate and Rhythm: Normal rate and regular rhythm.     Heart sounds: No murmur heard. Pulmonary:     Effort: Pulmonary effort is normal. No respiratory distress.     Breath sounds: Normal breath sounds.  Abdominal:     Palpations: Abdomen is soft.     Tenderness: There is abdominal tenderness. There is left CVA tenderness.     Comments: LLQ tenderness  Musculoskeletal:        General: No swelling.     Cervical back: Neck supple.  Skin:    General: Skin is warm and dry.     Capillary Refill: Capillary refill takes less than 2 seconds.  Neurological:     Mental Status: He is  alert and oriented to person, place, and time. Mental status is at baseline.  Psychiatric:        Mood and Affect: Mood normal.     (all labs ordered are listed, but only abnormal results are displayed) Labs Reviewed  COMPREHENSIVE METABOLIC PANEL WITH GFR - Abnormal; Notable for the following components:      Result Value   Glucose, Bld 106 (*)    All other components within normal limits  URINALYSIS, ROUTINE W REFLEX MICROSCOPIC - Abnormal; Notable for the following components:   APPearance HAZY (*)    Hgb urine dipstick LARGE (*)    Ketones, ur 20 (*)    Protein, ur 30 (*)    Bacteria, UA RARE (*)    All other components within normal limits  LIPASE, BLOOD  CBC     EKG: None  Radiology: CT ABDOMEN PELVIS W CONTRAST Result Date: 10/29/2024 EXAM: CT ABDOMEN AND PELVIS WITH CONTRAST 10/29/2024 03:10:00 AM TECHNIQUE: CT of the abdomen and pelvis was performed with the administration of 75 mL of iohexol  (OMNIPAQUE ) 350 MG/ML injection. Multiplanar reformatted images are provided for review. Automated exposure control, iterative reconstruction, and/or weight-based adjustment of the mA/kV was utilized to reduce the radiation dose to as low as reasonably achievable. COMPARISON: 09/24/2024 CLINICAL HISTORY: LLQ abdominal pain; hematuria, llq abdominal pain. FINDINGS: LOWER CHEST: Lung bases are free of acute infiltrate or sizable effusion. LIVER: The liver is unremarkable. GALLBLADDER AND BILE DUCTS: Gallbladder is unremarkable. No biliary ductal dilatation. SPLEEN: No acute abnormality. PANCREAS: No acute abnormality. ADRENAL GLANDS: No acute abnormality. KIDNEYS, URETERS AND BLADDER: Scattered cysts are noted within the kidneys. Previously seen 4 mm left renal calculus has now migrated to the ureteropelvic junction causing mild obstructive change. A few tiny nonobstructing stones are noted in the left lower pole. Distal ureters are within normal limits. Bladder is decompressed. No perinephric or periureteral stranding. GI AND BOWEL: Stomach and small bowel are unremarkable. No obstructive or inflammatory changes of the colon are seen. Changes of prior appendectomy are seen. PERITONEUM AND RETROPERITONEUM: No free fluid is noted. No free air. VASCULATURE: Aorta is normal in caliber. LYMPH NODES: No lymphadenopathy. REPRODUCTIVE ORGANS: The prostate is within normal limits. BONES AND SOFT TISSUES: No bony abnormality is seen. No focal soft tissue abnormality. IMPRESSION: 1. Left ureteropelvic junction calculus measuring 4 mm resulting in mild obstructive change. 2. Tiny Nonobstructing left renal calculi are noted Electronically signed by: Oneil Devonshire MD 10/29/2024 03:40 AM  EST RP Workstation: [id number]    Procedures   Medications Ordered in the ED  ketorolac  (TORADOL ) 15 MG/ML injection 15 mg (has no administration in time range)  oxyCODONE -acetaminophen  (PERCOCET/ROXICET) 5-325 MG per tablet 1 tablet (1 tablet Oral Given 10/29/24 0200)  ondansetron  (ZOFRAN -ODT) disintegrating tablet 8 mg (8 mg Oral Given 10/29/24 0159)  sodium chloride  0.9 % bolus 1,000 mL (0 mLs Intravenous Stopped 10/29/24 0452)  iohexol  (OMNIPAQUE ) 350 MG/ML injection 75 mL (75 mLs Intravenous Contrast Given 10/29/24 0311)  magnesium  sulfate IVPB 2 g 50 mL (0 g Intravenous Stopped 10/29/24 0452)    Clinical Course as of 10/29/24 0457  Mon Oct 29, 2024  0359 Mag, fluid, discharge [CG]    Clinical Course User Index [CG] Ruthell Lonni FALCON, PA-C   Medical Decision Making Amount and/or Complexity of Data Reviewed Labs: ordered. Radiology: ordered.  Risk Prescription drug management.   28 year old male presents to ED for evaluation of left lower quadrant abdominal pain and hematuria.  On exam, HD stable.  Lung sounds clear bilaterally, no hypoxia.  Afebrile and nontachycardic.  Abdomen with tenderness in left lower quadrant, left-sided CVA tenderness.  Neurological examination at baseline.  Chart reviewed.  Patient has been seen by his PCP over the last month with complaints of left-sided abdominal pain and diagnosed with nephrolithiasis.  CT scan obtained in October shows stone within the kidney.  CBC collected here is without leukocytosis or anemia.  Metabolic panel collected here shows no elevated creatinine, no electrolyte derangement, no elevated LFT, anion gap is 14.  Urinalysis shows large hemoglobin, ketones, protein and rare bacteria however patient denies dysuria, has no white count, has no fever.  Lipase 84.  CT abdomen pelvis shows left ureteropelvic junction calculus measuring 4 mm resulting in mild obstructive change.  This is most likely cause of patient symptoms.   Appears that patient kidney stone has now moved into ureter.  Patient was given magnesium , oxycodone , a liter of fluid.  Patient reports reduction of symptoms, states he feels better.    Final diagnoses:  Nephrolithiasis    ED Discharge Orders          Ordered    oxyCODONE -acetaminophen  (PERCOCET/ROXICET) 5-325 MG tablet  Every 6 hours PRN        10/29/24 0456    ondansetron  (ZOFRAN -ODT) 4 MG disintegrating tablet  Every 8 hours PRN        10/29/24 0456               Ruthell Lonni FALCON, PA-C 10/29/24 0457    Jerral Meth, MD 10/29/24 [phone]

## 2024-10-29 NOTE — ED Triage Notes (Signed)
 The pt is c/o abd pain and n and v today  he has had similar pain for one month  tonight the pain is more severe and he has blood in his urine and urine frequency

## 2024-10-29 NOTE — ED Notes (Signed)
 Pt currently in CT.

## 2024-11-15 ENCOUNTER — Other Ambulatory Visit: Payer: Self-pay | Admitting: Urology

## 2024-11-24 NOTE — Progress Notes (Signed)
 COVID Vaccine received:  [x]  No []  Yes Date of any COVID positive Test in last 90 days: none  PCP - Oneil Pinal, MD at [facility] [phone] (Work) [phone] (Fax)  Cardiologist -   Chest x-ray - 04-11-2017  2v  Epic EKG -  2018   will repeat 11-27-24 Stress Test -  ECHO -  Cardiac Cath -  CT Coronary Calcium score:   Pacemaker / ICD device [x]  No []  Yes   Spinal Cord Stimulator:[x]  No []  Yes       History of Sleep Apnea? [x]  No []  Yes   CPAP used?- [x]  No []  Yes    Medication on DOS: omeprazole, Oxycodone  APAP   Patient has: [x]  NO Hx DM   []  Pre-DM   []  DM1  []   DM2 Does the patient monitor blood sugar?   [x]  N/A   []  No []  Yes  Last A1c was: 5.4 normal on 07/13/2022      Blood Thinner / Instructions: none Aspirin Instructions:  none  Activity level: Able to walk up 2 flights of stairs without becoming significantly short of breath or having chest pain?   [x]    Yes   []  No,  would have:  Patient can perform ADLs without assistance.  [x]   Yes    []  No   Anesthesia review: ADHD, Migraines, GERD, Remote hx SVT, Some days cigs / cigars,   Patient denies any S&S of respiratory illness or Covid - no shortness of breath, fever, cough or chest pain at PAT appointment.  Patient verbalized understanding and agreement to the Pre-Surgical Instructions that were given to them at this PAT appointment. Patient was also educated of the need to review these PAT instructions again prior to his surgery.I reviewed the appropriate phone numbers to call if they have any and questions or concerns.

## 2024-11-24 NOTE — Patient Instructions (Signed)
 SURGICAL WAITING ROOM VISITATION Patients having surgery or a procedure may have no more than 2 support people in the waiting area - these visitors may rotate in the visitor waiting room.   If the patient needs to stay at the hospital during part of their recovery, the visitor guidelines for inpatient rooms apply.  PRE-OP VISITATION  Pre-op nurse will coordinate an appropriate time for 1 support person to accompany the patient in pre-op.  This support person may not rotate.  This visitor will be contacted when the time is appropriate for the visitor to come back in the pre-op area.  To keep our patients, visitors and teammates safe and prevent the spread of respiratory illnesses over the next few months.  Temporary Visitor Restrictions  Children ages 13 and under will not be able to visit patients in [facility] under most circumstances. Visitation is not restricted outside of hospitals unless noted otherwise in the [facility] and Location Specific Visitation Guidelines at:       [email or web address]  Visitors with respiratory illnesses are discouraged from visiting and should remain at home. You are not required to quarantine at this time prior to your surgery. However, you must do this: Hand Hygiene often Do NOT share personal items Notify your provider if you are in close contact with someone who has COVID or you develop fever 100.4 or greater, new onset of sneezing, cough, sore throat, shortness of breath or body aches.  If you test positive for Covid or have been in contact with anyone that has tested positive in the last 10 days please notify you surgeon.    Your procedure is scheduled on:  Friday  12-14-2024  Report to [facility] Main Entrance: [address] entrance where the [employer] is available.   Report to admitting at: 10:00 AM  Call this number if you have any questions or problems the morning of surgery [phone]  DO NOT EAT OR DRINK ANYTHING AFTER  MIDNIGHT THE NIGHT PRIOR TO YOUR SURGERY / PROCEDURE.   FOLLOW  ANY ADDITIONAL PRE OP INSTRUCTIONS YOU RECEIVED FROM YOUR SURGEON'S OFFICE!!!   Oral Hygiene is also important to reduce your risk of infection.        Remember - BRUSH YOUR TEETH THE MORNING OF SURGERY WITH YOUR REGULAR TOOTHPASTE  Do NOT smoke after Midnight the night before surgery.  STOP TAKING all Vitamins, Herbs and supplements 1 week before your surgery.   Take ONLY these medicines the morning of surgery with A SIP OF WATER:  omeprazole, Oxycodone  APAP                   You may not have any metal on your body including  jewelry, and body piercing  Do not wear  lotions, powders, cologne, or deodorant  Men may shave face and neck.  Contacts, Hearing Aids, dentures or bridgework may not be worn into surgery. DENTURES WILL BE REMOVED PRIOR TO SURGERY PLEASE DO NOT APPLY Poly grip OR ADHESIVES!!!  Patients discharged on the day of surgery will not be allowed to drive home.  Someone NEEDS to stay with you for the first 24 hours after anesthesia.  Do not bring your home medications to the hospital. The Pharmacy will dispense medications listed on your medication list to you during your admission in the Hospital.  Please read over the following fact sheets you were given: IF YOU HAVE QUESTIONS ABOUT YOUR PRE-OP INSTRUCTIONS, PLEASE CALL [phone].   [place] - Preparing for  Surgery      Before surgery, you can play an important role.  Because skin is not sterile, your skin needs to be as free of germs as possible.  You can reduce the number of germs on your skin by washing with CHG (chlorahexidine gluconate) soap before surgery.  CHG is an antiseptic cleaner which kills germs and bonds with the skin to continue killing germs even after washing. Please DO NOT use if you have an allergy to CHG or antibacterial soaps.  If your skin becomes reddened/irritated stop using the CHG and inform your nurse when you arrive at  Short Stay. Do not shave (including legs and underarms) for at least 48 hours prior to the first CHG shower.  You may shave your face/neck.  Please follow these instructions carefully:  1.  Shower with CHG Soap the night before surgery ONLY (DO NOT USE THE CHG SOAP THE MORNING OF SURGERY).  2.  If you choose to wash your hair, wash your hair first as usual with your normal  shampoo.  3.  After you shampoo, rinse your hair and body thoroughly to remove the shampoo.                             4.  Use CHG as you would any other liquid soap.  You can apply chg directly to the skin and wash.  Gently with a scrungie or clean washcloth.  5.  Apply the CHG Soap to your body ONLY FROM THE NECK DOWN.   Do not use on face/ open                           Wound or open sores. Avoid contact with eyes, ears mouth and genitals (private parts).                       Wash face,  Genitals (private parts) with your normal soap.             6.  Wash thoroughly, paying special attention to the area where your  surgery  will be performed.  7.  Thoroughly rinse your body with warm water from the neck down.  8.  DO NOT shower/wash with your normal soap after using and rinsing off the CHG Soap.                9.  Pat yourself dry with a clean towel.            10.  Wear clean pajamas.            11.  Place clean sheets on your bed the night of your first shower and do not  sleep with pets.  Day of Surgery : Do not apply any CHG, lotions/deodorants the morning of surgery.  Please wear clean clothes to the hospital/surgery center.   FAILURE TO FOLLOW THESE INSTRUCTIONS MAY RESULT IN THE CANCELLATION OF YOUR SURGERY  PATIENT SIGNATURE_________________________________  NURSE SIGNATURE__________________________________  ________________________________________________________________________

## 2024-11-27 ENCOUNTER — Encounter (HOSPITAL_COMMUNITY): Payer: Self-pay

## 2024-11-27 ENCOUNTER — Other Ambulatory Visit: Payer: Self-pay

## 2024-11-27 ENCOUNTER — Encounter (HOSPITAL_COMMUNITY)
Admission: RE | Admit: 2024-11-27 | Discharge: 2024-11-27 | Disposition: A | Discharge status: Home / Self Care | Source: Ambulatory Visit | Attending: Urology | Admitting: Urology

## 2024-11-27 VITALS — BP 120/64 | HR 78 | Temp 98.2°F | Resp 16 | Ht 70.5 in | Wt 165.0 lb

## 2024-11-27 DIAGNOSIS — Z01818 Encounter for other preprocedural examination: Secondary | ICD-10-CM | POA: Insufficient documentation

## 2024-11-27 DIAGNOSIS — N200 Calculus of kidney: Secondary | ICD-10-CM | POA: Insufficient documentation

## 2024-11-27 DIAGNOSIS — Z8679 Personal history of other diseases of the circulatory system: Secondary | ICD-10-CM | POA: Diagnosis not present

## 2024-11-27 HISTORY — DX: Personal history of urinary calculi: Z87.442

## 2024-11-27 HISTORY — DX: Attention-deficit hyperactivity disorder, unspecified type: F90.9

## 2024-11-27 HISTORY — DX: Gastro-esophageal reflux disease without esophagitis: K21.9

## 2024-11-27 LAB — BASIC METABOLIC PANEL WITH GFR
Anion gap: 8 (ref 5–15)
BUN: 16 mg/dL (ref 6–20)
CO2: 26 mmol/L (ref 22–32)
Calcium: 9.6 mg/dL (ref 8.9–10.3)
Chloride: 105 mmol/L (ref 98–111)
Creatinine, Ser: 0.88 mg/dL (ref 0.61–1.24)
GFR, Estimated: >60 mL/min
Glucose, Bld: 103 mg/dL — ABNORMAL HIGH (ref 70–99)
Potassium: 4.2 mmol/L (ref 3.5–5.1)
Sodium: 140 mmol/L (ref 135–145)

## 2024-11-27 LAB — CBC
HCT: 43 % (ref 39.0–52.0)
Hemoglobin: 14.8 g/dL (ref 13.0–17.0)
MCH: 30.5 pg (ref 26.0–34.0)
MCHC: 34.4 g/dL (ref 30.0–36.0)
MCV: 88.5 fL (ref 80.0–100.0)
Platelets: 206 K/uL (ref 150–400)
RBC: 4.86 MIL/uL (ref 4.22–5.81)
RDW: 11.9 % (ref 11.5–15.5)
WBC: 5.5 K/uL (ref 4.0–10.5)
nRBC: 0 % (ref 0.0–0.2)

## 2024-12-13 NOTE — Anesthesia Preprocedure Evaluation (Addendum)
 "                                  Anesthesia Evaluation  Patient identified by MRN, date of birth, ID band Patient awake    Reviewed: Allergy & Precautions, NPO status , Patient's Chart, lab work & pertinent test results  History of Anesthesia Complications Negative for: history of anesthetic complications  Airway Mallampati: I  TM Distance: >3 FB Neck ROM: Full    Dental no notable dental hx. (+) Teeth Intact, Dental Advisory Given   Pulmonary neg pulmonary ROS   Pulmonary exam normal breath sounds clear to auscultation       Cardiovascular (-) hypertension(-) angina (-) Past MI Normal cardiovascular exam(-) dysrhythmias  Rhythm:Regular Rate:Normal     Neuro/Psych  Headaches PSYCHIATRIC DISORDERS         GI/Hepatic ,GERD  ,,  Endo/Other    Renal/GU Renal disease     Musculoskeletal   Abdominal   Peds  Hematology Lab Results      Component                Value               Date                      WBC                      5.5                 11/27/2024                HGB                      14.8                11/27/2024                HCT                      43.0                11/27/2024                MCV                      88.5                11/27/2024                PLT                      206                 11/27/2024              Anesthesia Other Findings   Reproductive/Obstetrics                              Anesthesia Physical Anesthesia Plan  ASA: 1  Anesthesia Plan: General   Post-op Pain Management: Ofirmev  IV (intra-op)* and Precedex   Induction: Intravenous  PONV Risk Score and Plan: 2 and Treatment may vary due to age or medical condition, Midazolam , Dexamethasone  and Ondansetron   Airway Management Planned: LMA  Additional Equipment:  None  Intra-op Plan:   Post-operative Plan: Extubation in OR  Informed Consent: I have reviewed the patients History and Physical, chart, labs and  discussed the procedure including the risks, benefits and alternatives for the proposed anesthesia with the patient or authorized representative who has indicated his/her understanding and acceptance.     Dental advisory given  Plan Discussed with: CRNA and Surgeon  Anesthesia Plan Comments:          Anesthesia Quick Evaluation  "

## 2024-12-14 ENCOUNTER — Other Ambulatory Visit: Payer: Self-pay | Admitting: Urology

## 2024-12-14 ENCOUNTER — Ambulatory Visit (HOSPITAL_COMMUNITY): Admitting: Anesthesiology

## 2024-12-14 ENCOUNTER — Ambulatory Visit (HOSPITAL_COMMUNITY)

## 2024-12-14 ENCOUNTER — Encounter (HOSPITAL_COMMUNITY)
Admission: RE | Disposition: A | Discharge status: Home / Self Care | Payer: Self-pay | Source: Ambulatory Visit | Attending: Urology

## 2024-12-14 ENCOUNTER — Encounter (HOSPITAL_COMMUNITY): Payer: Self-pay | Admitting: Urology

## 2024-12-14 ENCOUNTER — Other Ambulatory Visit: Payer: Self-pay

## 2024-12-14 ENCOUNTER — Ambulatory Visit (HOSPITAL_COMMUNITY)
Admission: RE | Admit: 2024-12-14 | Discharge: 2024-12-14 | Disposition: A | Discharge status: Home / Self Care | Source: Ambulatory Visit | Attending: Urology | Admitting: Urology

## 2024-12-14 DIAGNOSIS — N200 Calculus of kidney: Secondary | ICD-10-CM

## 2024-12-14 DIAGNOSIS — N201 Calculus of ureter: Secondary | ICD-10-CM | POA: Insufficient documentation

## 2024-12-14 HISTORY — PX: CYSTOSCOPY/URETEROSCOPY/HOLMIUM LASER/STENT PLACEMENT: SHX6546

## 2024-12-14 SURGERY — CYSTOSCOPY/URETEROSCOPY/HOLMIUM LASER/STENT PLACEMENT
Anesthesia: General | Laterality: Left

## 2024-12-14 MED ORDER — TRAMADOL HCL 50 MG PO TABS: 50.0000 mg | ORAL_TABLET | Freq: Four times a day (QID) | ORAL | 0 refills | Status: DC | PRN

## 2024-12-14 MED ORDER — ORAL CARE MOUTH RINSE: 15.0000 mL | Freq: Once | OROMUCOSAL | Status: AC

## 2024-12-14 MED ORDER — ONDANSETRON HCL 4 MG/2ML IJ SOLN
INTRAMUSCULAR | Status: DC | PRN
  Administered 2024-12-14: 4 mg via INTRAVENOUS

## 2024-12-14 MED ORDER — OXYCODONE HCL 5 MG/5ML PO SOLN: 5.0000 mg | Freq: Once | ORAL | Status: DC | PRN

## 2024-12-14 MED ORDER — SODIUM CHLORIDE 0.9 % IR SOLN
Status: DC | PRN
  Administered 2024-12-14: 6000 mL

## 2024-12-14 MED ORDER — TAMSULOSIN HCL 0.4 MG PO CAPS: 0.4000 mg | ORAL_CAPSULE | Freq: Every day | ORAL | 0 refills | Status: DC

## 2024-12-14 MED ORDER — ONDANSETRON HCL 4 MG/2ML IJ SOLN
INTRAMUSCULAR | Status: AC
  Filled 2024-12-14: qty 2

## 2024-12-14 MED ORDER — HYDROMORPHONE HCL 1 MG/ML IJ SOLN
0.2500 mg | INTRAMUSCULAR | Status: DC | PRN
  Administered 2024-12-14 (×2): 0.5 mg via INTRAVENOUS

## 2024-12-14 MED ORDER — LIDOCAINE HCL (PF) 2 % IJ SOLN
INTRAMUSCULAR | Status: DC | PRN
  Administered 2024-12-14: 100 mg via INTRADERMAL

## 2024-12-14 MED ORDER — LIDOCAINE HCL URETHRAL/MUCOSAL 2 % EX GEL
CUTANEOUS | Status: DC | PRN
  Administered 2024-12-14: 1

## 2024-12-14 MED ORDER — LIDOCAINE HCL URETHRAL/MUCOSAL 2 % EX GEL
CUTANEOUS | Status: AC
  Filled 2024-12-14: qty 30

## 2024-12-14 MED ORDER — FENTANYL CITRATE (PF) 100 MCG/2ML IJ SOLN
INTRAMUSCULAR | Status: DC | PRN
  Administered 2024-12-14: 100 ug via INTRAVENOUS

## 2024-12-14 MED ORDER — CHLORHEXIDINE GLUCONATE 0.12 % MT SOLN
15.0000 mL | Freq: Once | OROMUCOSAL | Status: AC
  Administered 2024-12-14: 15 mL via OROMUCOSAL

## 2024-12-14 MED ORDER — IOHEXOL 300 MG/ML  SOLN
INTRAMUSCULAR | Status: DC | PRN
  Administered 2024-12-14: 20 mL

## 2024-12-14 MED ORDER — LACTATED RINGERS IV SOLN: INTRAVENOUS | Status: DC

## 2024-12-14 MED ORDER — PROPOFOL 10 MG/ML IV BOLUS
INTRAVENOUS | Status: DC | PRN
  Administered 2024-12-14: 200 mg via INTRAVENOUS

## 2024-12-14 MED ORDER — CEFAZOLIN SODIUM-DEXTROSE 2-4 GM/100ML-% IV SOLN
2.0000 g | INTRAVENOUS | Status: AC
  Administered 2024-12-14: 2 g via INTRAVENOUS
  Filled 2024-12-14: qty 100

## 2024-12-14 MED ORDER — ONDANSETRON HCL 4 MG/2ML IJ SOLN
4.0000 mg | Freq: Once | INTRAMUSCULAR | Status: AC | PRN
  Administered 2024-12-14: 4 mg via INTRAVENOUS

## 2024-12-14 MED ORDER — ACETAMINOPHEN 10 MG/ML IV SOLN
INTRAVENOUS | Status: AC
  Filled 2024-12-14: qty 100

## 2024-12-14 MED ORDER — HYDROMORPHONE HCL 1 MG/ML IJ SOLN
INTRAMUSCULAR | Status: AC
  Filled 2024-12-14: qty 1

## 2024-12-14 MED ORDER — DEXAMETHASONE SOD PHOSPHATE PF 10 MG/ML IJ SOLN
INTRAMUSCULAR | Status: DC | PRN
  Administered 2024-12-14: 10 mg via INTRAVENOUS

## 2024-12-14 MED ORDER — KETOROLAC TROMETHAMINE 30 MG/ML IJ SOLN
INTRAMUSCULAR | Status: DC | PRN
  Administered 2024-12-14: 30 mg via INTRAVENOUS

## 2024-12-14 MED ORDER — ACETAMINOPHEN 10 MG/ML IV SOLN
1000.0000 mg | Freq: Once | INTRAVENOUS | Status: DC | PRN
  Administered 2024-12-14: 1000 mg via INTRAVENOUS

## 2024-12-14 MED ORDER — MIDAZOLAM HCL 5 MG/5ML IJ SOLN
INTRAMUSCULAR | Status: DC | PRN
  Administered 2024-12-14: 2 mg via INTRAVENOUS

## 2024-12-14 MED ORDER — OXYCODONE HCL 5 MG PO TABS: 5.0000 mg | ORAL_TABLET | Freq: Once | ORAL | Status: DC | PRN

## 2024-12-14 SURGICAL SUPPLY — 19 items
BAG URO CATCHER STRL LF (MISCELLANEOUS) ×1 IMPLANT
BASKET ZERO TIP NITINOL 2.4FR (BASKET) IMPLANT
CATH URETERAL DUAL LUMEN 10F (MISCELLANEOUS) IMPLANT
CATH URETL OPEN 5X70 (CATHETERS) ×1 IMPLANT
CLOTH BEACON ORANGE TIMEOUT ST (SAFETY) ×1 IMPLANT
DILATOR BALLN URETERAL SET (BALLOONS) IMPLANT
GLOVE SURG LX STRL 7.5 STRW (GLOVE) ×1 IMPLANT
GOWN STRL REUS W/ TWL XL LVL3 (GOWN DISPOSABLE) ×1 IMPLANT
GUIDEWIRE ANG ZIPWIRE 038X150 (WIRE) IMPLANT
GUIDEWIRE STR DUAL SENSOR (WIRE) ×1 IMPLANT
KIT TURNOVER KIT A (KITS) ×1 IMPLANT
MANIFOLD NEPTUNE II (INSTRUMENTS) ×1 IMPLANT
PACK CYSTO (CUSTOM PROCEDURE TRAY) ×1 IMPLANT
SHEATH NAVIGATOR HD 11/13X28 (SHEATH) IMPLANT
SHEATH NAVIGATOR HD 11/13X36 (SHEATH) IMPLANT
STENT CONTOUR 7FRX26X.038 (STENTS) IMPLANT
TRACTIP FLEXIVA PULS ID 200XHI (Laser) IMPLANT
TUBING CONNECTING 10 (TUBING) ×1 IMPLANT
TUBING UROLOGY SET (TUBING) ×1 IMPLANT

## 2024-12-14 NOTE — Transfer of Care (Signed)
 Immediate Anesthesia Transfer of Care Note  Patient: Matthew Bird  Procedure(s) Performed: CYSTOSCOPY/URETEROSCOPY/HOLMIUM LASER/STENT PLACEMENT (Left)  Patient Location: PACU  Anesthesia Type:General  Level of Consciousness: awake, alert , and oriented  Airway & Oxygen Therapy: Patient Spontanous Breathing  Post-op Assessment: Report given to RN and Post -op Vital signs reviewed and stable  Post vital signs: Reviewed and stable  Last Vitals:  Vitals Value Taken Time  BP 111/69 12/14/24 14:15  Temp    Pulse 61 12/14/24 14:15  Resp 14 12/14/24 14:15  SpO2 98 % 12/14/24 14:15  Vitals shown include unfiled device data.  Last Pain:  Vitals:   12/14/24 1013  TempSrc: Oral  PainSc: 0-No pain         Complications: No notable events documented.

## 2024-12-14 NOTE — Interval H&P Note (Signed)
 History and Physical Interval Note:  12/14/2024 12:29 PM  Matthew Bird  has presented today for surgery, with the diagnosis of LEFT KIDNEY STONE.  The various methods of treatment have been discussed with the patient and family. After consideration of risks, benefits and other options for treatment, the patient has consented to  Procedures with comments: CYSTOSCOPY/URETEROSCOPY/HOLMIUM LASER/STENT PLACEMENT (Left) - CYSTOSCOPY/LEFT URETEROSCOPY/HOLMIUM LASER/STENT PLACEMENT as a surgical intervention.  The patient's history has been reviewed, patient examined, no change in status, stable for surgery.  I have reviewed the patient's chart and labs.  Questions were answered to the patient's satisfaction.     Morene LELON Salines

## 2024-12-14 NOTE — Discharge Instructions (Signed)
 DISCHARGE INSTRUCTIONS FOR KIDNEY STONE/URETERAL STENT   MEDICATIONS:  1.  Resume all your other meds from home - except do not take any extra narcotic pain meds that you may have at home.  2. Tamsulosin  is for stent discomfort 3. Tramadol  is for moderate/severe pain, otherwise taking upto 1000 mg every 6 hours of plainTylenol will help treat your pain.   4. Take Cipro one hour prior to removal of your stent.   ACTIVITY:  1. No strenuous activity x 1week  2. No driving while on narcotic pain medications  3. Drink plenty of water  4. Continue to walk at home - you can still get blood clots when you are at home, so keep active, but don't over do it.  5. May return to work/school tomorrow or when you feel ready   BATHING:  1. You can shower and we recommend daily showers  2. You have a string coming from your urethra: The stent string is attached to your ureteral stent. Do not pull on this.   SIGNS/SYMPTOMS TO CALL:  Please call us  if you have a fever greater than 101.5, uncontrolled nausea/vomiting, uncontrolled pain, dizziness, unable to urinate, bloody urine, chest pain, shortness of breath, leg swelling, leg pain, redness around wound, drainage from wound, or any other concerns or questions.   You can reach us  at [phone].   FOLLOW-UP:  We will call you and reschedule for stone removal in the coming weeks.

## 2024-12-14 NOTE — Anesthesia Procedure Notes (Signed)
 Procedure Name: LMA Insertion Date/Time: 12/14/2024 12:37 PM  Performed by: Carleton Garnette SAUNDERS, CRNAPre-anesthesia Checklist: Patient identified, Emergency Drugs available, Suction available, Patient being monitored and Timeout performed Patient Re-evaluated:Patient Re-evaluated prior to induction Oxygen Delivery Method: Circle system utilized Preoxygenation: Pre-oxygenation with 100% oxygen Induction Type: IV induction LMA: LMA inserted LMA Size: 4.0 Tube type: Oral Number of attempts: 1 Placement Confirmation: positive ETCO2 and breath sounds checked- equal and bilateral Tube secured with: Tape Dental Injury: Teeth and Oropharynx as per pre-operative assessment

## 2024-12-14 NOTE — Anesthesia Postprocedure Evaluation (Signed)
"   Anesthesia Post Note  Patient: Matthew Bird  Procedure(s) Performed: CYSTOSCOPY/URETEROSCOPY/HOLMIUM LASER/STENT PLACEMENT (Left)     Patient location during evaluation: PACU Anesthesia Type: General Level of consciousness: awake and alert Pain management: pain level controlled Vital Signs Assessment: post-procedure vital signs reviewed and stable Respiratory status: spontaneous breathing, nonlabored ventilation, respiratory function stable and patient connected to nasal cannula oxygen Cardiovascular status: blood pressure returned to baseline and stable Postop Assessment: no apparent nausea or vomiting Anesthetic complications: no   No notable events documented.  Last Vitals:  Vitals:   12/14/24 1514 12/14/24 1530  BP: 107/65 102/62  Pulse: (!) 58 (!) 59  Resp: 16 20  Temp: 36.8 C (!) 36.1 C  SpO2: 100% 100%    Last Pain:  Vitals:   12/14/24 1530  TempSrc: Oral  PainSc: 0-No pain                 Garnette LABOR Aki Abalos      "

## 2024-12-14 NOTE — Op Note (Signed)
 Preoperative diagnosis: left ureteral calculus  Postoperative diagnosis: left ureteral calculus  Procedure:  Cystoscopy left ureteroscopy and left ureteral dilation left 65F x 26cm ureteral stent placement  left retrograde pyelography with interpretation  Surgeon: Morene MICAEL Salines, MD  Anesthesia: General  Complications: None  Intraoperative findings:  1: The patient had a retrograde pyelogram demonstrating a narrow UPJ and UVJ.  There were no other additional filling defects.  There is no hydronephrosis. #2: I was unable to access the ureter initially because I could not advance the single-lumen flexible ureteroscope into the left UO.  I subsequently dilated it with a 15 French x 6 cm ureteral balloon dilator. #3: The proximal ureter/UPJ was also very tight and inaccessible.  I did try to balloon dilate that, but ended up creating ureteral extravasation on the follow-up retrograde and subsequently opted at this point to place a stent and not to proceed in stone removal. #4: 7 cm x 26 cm stent was placed at the end of the case.  The stent tether was removed.  EBL: Minimal  Specimens: None  Disposition of specimens: [facility] for stone analysis  Indication: Matthew Bird is a 29 y.o.   patient with a  left ureteral stone and associated left symptoms. After reviewing the management options for treatment, the patient elected to proceed with the above surgical procedure(s). We have discussed the potential benefits and risks of the procedure, side effects of the proposed treatment, the likelihood of the patient achieving the goals of the procedure, and any potential problems that might occur during the procedure or recuperation. Informed consent has been obtained.   Description of procedure:  The patient was taken to the operating room and general anesthesia was induced.  The patient was placed in the dorsal lithotomy position, prepped and draped in the usual sterile  fashion, and preoperative antibiotics were administered. A preoperative time-out was performed.   Cystourethroscopy was performed.  The patients urethra was examined and was normal. The bladder was then systematically examined in its entirety. There was no evidence for any bladder tumors, stones, or other mucosal pathology.    I cannulated the patient's left ureteral orifice with a 5 French open-ended catheter and performed retrograde pyelogram with the above findings.  I then advanced a wire up through the open-ended catheter removing the open-ended over the wire.  I then used the dual-lumen catheter and advanced it up into the proximal ureter and passed a second wire through the dual-lumen dilating the ureter slightly and removing the dual-lumen over the wire.  I then attempted to advance the single-lumen flexible ureteroscope over the wire and into the left ureter.  However, I was unable to cannulate the left ureteral orifice.  After numerous attempts I removed the scope and opted to dilate the distal portion of the ureter.  I used a 6 cm x 15 French UroMax balloon.  Once the distal ureter/UVJ was dilated I was able to advance the scope over the wire and into the proximal ureter.  However I was unable to get beyond this area.  I then attempted to balloon dilate the proximal ureter/UPJ.  I gently dilated the ureter with the balloon to about 5 cc H2O and then removed the balloon.  I then advanced the wire back into the proximal ureter and was not able to get it past the UPJ.  I performed a retrograde pyelogram demonstrating extravasation.  At this point I opted not to proceed further and place a stent.  I slowly backed out the ureteroscope leaving the wire behind.  I then backloaded the wire through the cystoscope and repassed the cystoscope into the patient's bladder.  I then advanced a 7 French x 26 cm double-J ureteral stent over the wire and into the upper pole of the left kidney.  It was well positioned  under fluoroscopic guidance.  Once it was noted to be well within the upper pole of the kidney I advanced into the bladder prior to removing the wire.  Excellent position was confirmed on fluoroscopy.  I then drained the patient's bladder and remove the scope.  Disposition: The patient will be rescheduled for 2 weeks to have his stone removed and stent exchange.

## 2024-12-14 NOTE — Progress Notes (Signed)
" ° ° °  PROCEDURAL EXPEDITER PROGRESS NOTE  Patient Name: Matthew Bird  DOB:[birth year] Date of Admission: 12/14/2024  Date of Assessment:12/14/2024   -------------------------------------------------------------------------------------------------------------------   Brief clinical summary: going to OR on 12-14-24  Orders in place:   Yes   Communication with surgical team if no orders: n/a  Labs, test, and orders reviewed: yes  Requires surgical clearance:   No  What type of clearance: n/a  Clearance received: n/a  Barriers noted:n/a   Intervention provided by [facility] team: n/a  Barrier resolved:   not applicable   -------------------------------------------------------------------------------------------------------------------  [employer], Rexene LITTIE Kirks Please contact us  directly via secure chat (search for [facility]) or by calling us  at [phone] [facility]).  "

## 2024-12-17 ENCOUNTER — Encounter (HOSPITAL_COMMUNITY): Payer: Self-pay | Admitting: Urology

## 2024-12-21 NOTE — Patient Instructions (Signed)
 SURGICAL WAITING ROOM VISITATION Patients having surgery or a procedure may have no more than 2 support people in the waiting area - these visitors may rotate in the visitor waiting room.   Due to an increase in RSV and influenza rates and associated hospitalizations, children ages 25 and under may not visit patients in [facility] hospitals. If the patient needs to stay at the hospital during part of their recovery, the visitor guidelines for inpatient rooms apply.  PRE-OP VISITATION  Pre-op nurse will coordinate an appropriate time for 1 support person to accompany the patient in pre-op.  This support person may not rotate.  This visitor will be contacted when the time is appropriate for the visitor to come back in the pre-op area.  Please refer to the [facility] website for the visitor guidelines for Inpatients (after your surgery is over and you are in a regular room).  You are not required to quarantine at this time prior to your surgery. However, you must do this: Hand Hygiene often Do NOT share personal items Notify your provider if you are in close contact with someone who has COVID or you develop fever 100.4 or greater, new onset of sneezing, cough, sore throat, shortness of breath or body aches.  If you test positive for Covid or have been in contact with anyone that has tested positive in the last 10 days please notify you surgeon.    Your procedure is scheduled on:  12/27/24  Report to [facility] Main Entrance: [place] entrance where the [employer] is available.   Report to admitting at: 6:45 AM  Call this number if you have any questions or problems the morning of surgery [phone]  FOLLOW ANY ADDITIONAL PRE OP INSTRUCTIONS YOU RECEIVED FROM YOUR SURGEON'S OFFICE!!!  Do not eat food or drink fluids after Midnight the night prior to your surgery/procedure.   Oral Hygiene is also important to reduce your risk of infection.        Remember - BRUSH YOUR TEETH  THE MORNING OF SURGERY WITH YOUR REGULAR TOOTHPASTE  Do NOT smoke after Midnight the night before surgery.  STOP TAKING all Vitamins, Herbs and supplements 1 week before your surgery.   Take ONLY these medicines the morning of surgery with A SIP OF WATER: Omeprazole.   If You have been diagnosed with Sleep Apnea - Bring CPAP mask and tubing day of surgery. We will provide you with a CPAP machine on the day of your surgery.                   You may not have any metal on your body including hair pins, jewelry, and body piercing  Do not wear lotions, powders, perfumes / cologne, or deodorant  Men may shave face and neck.  Contacts, Hearing Aids, dentures or bridgework may not be worn into surgery. DENTURES WILL BE REMOVED PRIOR TO SURGERY PLEASE DO NOT APPLY Poly grip OR ADHESIVES!!!  You may bring a small overnight bag with you on the day of surgery, only pack items that are not valuable. [facility] IS NOT RESPONSIBLE   FOR VALUABLES THAT ARE LOST OR STOLEN.   Patients discharged on the day of surgery will not be allowed to drive home.  Someone NEEDS to stay with you for the first 24 hours after anesthesia.  Do not bring your home medications to the hospital. The Pharmacy will dispense medications listed on your medication list to you during your admission in  the Hospital.  Special Instructions: Bring a copy of your healthcare power of attorney and living will documents the day of surgery, if you wish to have them scanned into your [facility] Records- EPIC  Please read over the following fact sheets you were given: IF YOU HAVE QUESTIONS ABOUT YOUR PRE-OP INSTRUCTIONS, PLEASE CALL [phone]   [facility] - Preparing for Surgery      Before surgery, you can play an important role.  Because skin is not sterile, your skin needs to be as free of germs as possible.  You can reduce the number of germs on your skin by washing with CHG (chlorahexidine gluconate) soap before  surgery.  CHG is an antiseptic cleaner which kills germs and bonds with the skin to continue killing germs even after washing. Please DO NOT use if you have an allergy to CHG or antibacterial soaps.  If your skin becomes reddened/irritated stop using the CHG and inform your nurse when you arrive at Short Stay. Do not shave (including legs and underarms) for at least 48 hours prior to the first CHG shower.  You may shave your face/neck.  Please follow these instructions carefully:  1.  Shower with CHG Soap the night before surgery ONLY (DO NOT USE THE SOAP THE MORNING OF SURGERY).  2.  If you choose to wash your hair, wash your hair first as usual with your normal  shampoo.  3.  After you shampoo, rinse your hair and body thoroughly to remove the shampoo.                             4.  Use CHG as you would any other liquid soap.  You can apply chg directly to the skin and wash.  Gently with a scrungie or clean washcloth.  5.  Apply the CHG Soap to your body ONLY FROM THE NECK DOWN.   Do not use on face/ open                           Wound or open sores. Avoid contact with eyes, ears mouth and genitals (private parts).                       Wash face,  Genitals (private parts) with your normal soap.             6.  Wash thoroughly, paying special attention to the area where your  surgery  will be performed.  7.  Thoroughly rinse your body with warm water from the neck down.  8.  DO NOT shower/wash with your normal soap after using and rinsing off the CHG Soap.                9.  Pat yourself dry with a clean towel.            10.  Wear clean pajamas.            11.  Place clean sheets on your bed the night of your first shower and do not  sleep with pets.  Day of Surgery : Do not apply any CHG, lotions/deodorants the morning of surgery.  Please wear clean clothes to the hospital/surgery center.   FAILURE TO FOLLOW THESE INSTRUCTIONS MAY RESULT IN THE CANCELLATION OF YOUR SURGERY  PATIENT  SIGNATURE_________________________________  NURSE SIGNATURE__________________________________  ________________________________________________________________________

## 2024-12-24 ENCOUNTER — Other Ambulatory Visit: Payer: Self-pay

## 2024-12-24 ENCOUNTER — Encounter (HOSPITAL_COMMUNITY)
Admission: RE | Admit: 2024-12-24 | Discharge: 2024-12-24 | Disposition: A | Discharge status: Home / Self Care | Source: Ambulatory Visit | Attending: Urology | Admitting: Urology

## 2024-12-24 ENCOUNTER — Encounter (HOSPITAL_COMMUNITY): Payer: Self-pay

## 2024-12-24 NOTE — Progress Notes (Signed)
 For Anesthesia: PCP - Oneil Pinal, MD  Cardiologist - N/A  Bowel Prep reminder:  Chest x-ray -  EKG - 11/27/24 Stress Test -  ECHO -  Cardiac Cath -  Pacemaker/ICD device last checked: Pacemaker orders received: Device Rep notified:  Spinal Cord Stimulator:N/A  Sleep Study - N/A CPAP -   Fasting Blood Sugar - N/A Checks Blood Sugar _____ times a day Date and result of last Hgb A1c-  Last dose of GLP1 agonist- N/A GLP1 instructions: Hold 7 days prior to schedule (Hold 24 hours-daily)   Last dose of SGLT-2 inhibitors- N/A SGLT-2 instructions: Hold 72 hours prior to surgery  Blood Thinner Instructions:N/A Last Dose: Time last taken:  Aspirin Instructions:N/A Last Dose: Time last taken:  Activity level: Can go up a flight of stairs and activities of daily living without stopping and without chest pain and/or shortness of breath   Able to exercise without chest pain and/or shortness of breath  Anesthesia review: Hx: ADHD, Migraines, GERD, Remote hx SVT, Some days cigs / cigars,   Patient denies shortness of breath, fever, cough and chest pain at PAT appointment   Patient verbalized understanding of instructions that were reviewed over the telephone.

## 2024-12-27 ENCOUNTER — Encounter (HOSPITAL_COMMUNITY): Payer: Self-pay | Admitting: Medical

## 2024-12-27 ENCOUNTER — Encounter (HOSPITAL_COMMUNITY): Admission: RE | Disposition: A | Payer: Self-pay | Source: Home / Self Care | Attending: Urology

## 2024-12-27 ENCOUNTER — Other Ambulatory Visit: Payer: Self-pay

## 2024-12-27 ENCOUNTER — Ambulatory Visit (HOSPITAL_COMMUNITY): Admitting: Anesthesiology

## 2024-12-27 ENCOUNTER — Encounter (HOSPITAL_COMMUNITY): Payer: Self-pay | Admitting: Urology

## 2024-12-27 ENCOUNTER — Ambulatory Visit (HOSPITAL_COMMUNITY)

## 2024-12-27 ENCOUNTER — Ambulatory Visit (HOSPITAL_COMMUNITY)
Admission: RE | Admit: 2024-12-27 | Discharge: 2024-12-27 | Disposition: A | Discharge status: Home / Self Care | Attending: Urology | Admitting: Urology

## 2024-12-27 DIAGNOSIS — Z79899 Other long term (current) drug therapy: Secondary | ICD-10-CM | POA: Diagnosis not present

## 2024-12-27 DIAGNOSIS — N201 Calculus of ureter: Secondary | ICD-10-CM | POA: Insufficient documentation

## 2024-12-27 DIAGNOSIS — F17210 Nicotine dependence, cigarettes, uncomplicated: Secondary | ICD-10-CM | POA: Diagnosis not present

## 2024-12-27 DIAGNOSIS — N200 Calculus of kidney: Secondary | ICD-10-CM | POA: Diagnosis present

## 2024-12-27 DIAGNOSIS — K219 Gastro-esophageal reflux disease without esophagitis: Secondary | ICD-10-CM | POA: Insufficient documentation

## 2024-12-27 MED ORDER — FENTANYL CITRATE (PF) 50 MCG/ML IJ SOSY
25.0000 ug | PREFILLED_SYRINGE | INTRAMUSCULAR | Status: DC | PRN
  Administered 2024-12-27: 50 ug via INTRAVENOUS

## 2024-12-27 MED ORDER — ONDANSETRON HCL 4 MG/2ML IJ SOLN
INTRAMUSCULAR | Status: AC
  Filled 2024-12-27: qty 2

## 2024-12-27 MED ORDER — DROPERIDOL 2.5 MG/ML IJ SOLN
INTRAMUSCULAR | Status: AC
  Filled 2024-12-27: qty 2

## 2024-12-27 MED ORDER — CHLORHEXIDINE GLUCONATE 0.12 % MT SOLN
15.0000 mL | Freq: Once | OROMUCOSAL | Status: AC
  Administered 2024-12-27: 15 mL via OROMUCOSAL

## 2024-12-27 MED ORDER — TAMSULOSIN HCL 0.4 MG PO CAPS: 0.4000 mg | ORAL_CAPSULE | Freq: Every day | ORAL | 0 refills | Status: AC

## 2024-12-27 MED ORDER — LIDOCAINE HCL (CARDIAC) PF 100 MG/5ML IV SOSY
PREFILLED_SYRINGE | INTRAVENOUS | Status: DC | PRN
  Administered 2024-12-27: 60 mg via INTRAVENOUS

## 2024-12-27 MED ORDER — IOHEXOL 300 MG/ML  SOLN
INTRAMUSCULAR | Status: DC | PRN
  Administered 2024-12-27: 19 mL

## 2024-12-27 MED ORDER — LIDOCAINE HCL (PF) 2 % IJ SOLN
INTRAMUSCULAR | Status: AC
  Filled 2024-12-27: qty 5

## 2024-12-27 MED ORDER — FENTANYL CITRATE (PF) 100 MCG/2ML IJ SOLN
INTRAMUSCULAR | Status: AC
  Filled 2024-12-27: qty 2

## 2024-12-27 MED ORDER — DROPERIDOL 2.5 MG/ML IJ SOLN
0.6250 mg | Freq: Once | INTRAMUSCULAR | Status: AC | PRN
  Administered 2024-12-27: 0.625 mg via INTRAVENOUS

## 2024-12-27 MED ORDER — PROPOFOL 10 MG/ML IV BOLUS
INTRAVENOUS | Status: DC | PRN
  Administered 2024-12-27: 150 mg via INTRAVENOUS

## 2024-12-27 MED ORDER — CEFAZOLIN SODIUM-DEXTROSE 2-4 GM/100ML-% IV SOLN
2.0000 g | INTRAVENOUS | Status: AC
  Administered 2024-12-27: 2 g via INTRAVENOUS
  Filled 2024-12-27: qty 100

## 2024-12-27 MED ORDER — FENTANYL CITRATE (PF) 100 MCG/2ML IJ SOLN
INTRAMUSCULAR | Status: DC | PRN
  Administered 2024-12-27 (×4): 50 ug via INTRAVENOUS

## 2024-12-27 MED ORDER — ONDANSETRON HCL 4 MG/2ML IJ SOLN
INTRAMUSCULAR | Status: DC | PRN
  Administered 2024-12-27: 4 mg via INTRAVENOUS

## 2024-12-27 MED ORDER — OXYCODONE HCL 5 MG PO TABS: 5.0000 mg | ORAL_TABLET | ORAL | 0 refills | Status: DC | PRN

## 2024-12-27 MED ORDER — SODIUM CHLORIDE 0.9 % IR SOLN
Status: DC | PRN
  Administered 2024-12-27: 3000 mL via INTRAVESICAL

## 2024-12-27 MED ORDER — ORAL CARE MOUTH RINSE: 15.0000 mL | Freq: Once | OROMUCOSAL | Status: AC

## 2024-12-27 MED ORDER — LACTATED RINGERS IV SOLN: INTRAVENOUS | Status: DC

## 2024-12-27 MED ORDER — MIDAZOLAM HCL (PF) 2 MG/2ML IJ SOLN
INTRAMUSCULAR | Status: DC | PRN
  Administered 2024-12-27: 2 mg via INTRAVENOUS

## 2024-12-27 MED ORDER — FENTANYL CITRATE (PF) 50 MCG/ML IJ SOSY
PREFILLED_SYRINGE | INTRAMUSCULAR | Status: AC
  Filled 2024-12-27: qty 2

## 2024-12-27 MED ORDER — OXYCODONE HCL 5 MG PO TABS
5.0000 mg | ORAL_TABLET | Freq: Once | ORAL | Status: AC | PRN
  Administered 2024-12-27: 5 mg via ORAL

## 2024-12-27 MED ORDER — PROPOFOL 10 MG/ML IV BOLUS
INTRAVENOUS | Status: AC
  Filled 2024-12-27: qty 20

## 2024-12-27 MED ORDER — DEXAMETHASONE SOD PHOSPHATE PF 10 MG/ML IJ SOLN
INTRAMUSCULAR | Status: DC | PRN
  Administered 2024-12-27: 10 mg via INTRAVENOUS

## 2024-12-27 MED ORDER — MIDAZOLAM HCL 2 MG/2ML IJ SOLN
INTRAMUSCULAR | Status: AC
  Filled 2024-12-27: qty 2

## 2024-12-27 MED ORDER — DEXAMETHASONE SOD PHOSPHATE PF 10 MG/ML IJ SOLN
INTRAMUSCULAR | Status: AC
  Filled 2024-12-27: qty 1

## 2024-12-27 MED ORDER — OXYCODONE HCL 5 MG PO TABS
ORAL_TABLET | ORAL | Status: AC
  Filled 2024-12-27: qty 1

## 2024-12-27 MED ORDER — OXYCODONE HCL 5 MG PO TABS: 5.0000 mg | ORAL_TABLET | ORAL | 0 refills | Status: AC | PRN

## 2024-12-27 MED ORDER — CIPROFLOXACIN HCL 500 MG PO TABS: 500.0000 mg | ORAL_TABLET | Freq: Once | ORAL | 0 refills | Status: AC

## 2024-12-27 NOTE — Discharge Instructions (Signed)
 DISCHARGE INSTRUCTIONS FOR KIDNEY STONE/URETERAL STENT   MEDICATIONS:  1.  Resume all your other meds from home - except do not take any extra narcotic pain meds that you may have at home.  2. Oxcodone is for moderate/severe pain, otherwise taking upto 1000 mg every 6 hours of plainTylenol will help treat your pain.   3. Take Cipro  one hour prior to removal of your stent.   ACTIVITY:  1. No strenuous activity x 1week  2. No driving while on narcotic pain medications  3. Drink plenty of water  4. Continue to walk at home - you can still get blood clots when you are at home, so keep active, but don't over do it.  5. May return to work/school tomorrow or when you feel ready   BATHING:  1. You can shower and we recommend daily showers  2. You have a string coming from your urethra: The stent string is attached to your ureteral stent. Do not pull on this.   SIGNS/SYMPTOMS TO CALL:  Please call us  if you have a fever greater than 101.5, uncontrolled nausea/vomiting, uncontrolled pain, dizziness, unable to urinate, bloody urine, chest pain, shortness of breath, leg swelling, leg pain, redness around wound, drainage from wound, or any other concerns or questions.   You can reach us  at [phone].   FOLLOW-UP:  1. You have an appointment in 6 weeks with a ultrasound of your kidneys prior.   2. You have a string attached to your stent, you may remove it on Monday, Jan 26th. To do this, pull the strings until the stents are completely removed. You may feel an odd sensation in your back.

## 2024-12-27 NOTE — Op Note (Signed)
 Preoperative diagnosis: left ureteral calculus  Postoperative diagnosis: left ureteral calculus  Procedure:  Cystoscopy left ureteroscopy and stone removal Ureteroscopic laser lithotripsy left 4.34F x 28 ureteral stent exchange left retrograde pyelography with interpretation  Surgeon: Morene MICAEL Salines, MD  Anesthesia: General  Complications: None  Intraoperative findings:  #1 - left retrograde pyelography demonstrated a filling defect within the left ureter consistent with the patients known calculus without other abnormalities. #2 -The ureter was fully intact, at the UPJ was tight, but had been dilated well.  All stone fragments were completely removed. #3 28 cm x 4.8 French double-J ureteral stent was exchanged the left ureter.  EBL: Minimal  Specimens: left ureteral calculus  Disposition of specimens: [facility] for stone analysis  Indication: Matthew Bird is a 29 y.o.   patient with a left ureteral stone and associated left symptoms. After reviewing the management options for treatment, the patient elected to proceed with the above surgical procedure(s). We have discussed the potential benefits and risks of the procedure, side effects of the proposed treatment, the likelihood of the patient achieving the goals of the procedure, and any potential problems that might occur during the procedure or recuperation. Informed consent has been obtained.   Description of procedure:  The patient was taken to the operating room and general anesthesia was induced.  The patient was placed in the dorsal lithotomy position, prepped and draped in the usual sterile fashion, and preoperative antibiotics were administered. A preoperative time-out was performed.   Cystourethroscopy was performed.  The patients urethra was examined and was normal. The bladder was then systematically examined in its entirety. There was no evidence for any bladder tumors, stones, or other mucosal  pathology.    Stent emanating from the patient's left ureteral orifice was grasped with a stent grasper and pulled down to the urethral meatus.  I then advanced a wire up through the stent and remove the stent over the wire.  I then exchanged the wire for an open-ended ureteral catheter and performed retrograde pyelogram with the above findings.  I then replaced the wire and remove the open-ended catheter.  I then used a single-lumen semirigid ureteroscope and gently navigated through the patient's urethra and into the bladder.  I cannulated the left ureteral orifice and advanced it up to the midportion of the ureter.  It was tight and there was a steep angle at the pelvis and at this point I opted to pass a second wire and then remove the semirigid ureteroscope over the wire.  I then advanced a single-lumen flexible ureteroscope over the wire and was able to easily advance this up into the proximal ureter and across the UPJ.  I remove the wire.  I then performed pyeloscopy and found the patient's stone in the lower pole.  Using an engage stone basket I grasped the stone and pulled it into the upper pole.  I then used a 200 m laser fiber and broke the stone into 3-4 small pieces.  I then systematically remove these pieces completely repassing the ureteroscope each time.  Once all the stone fragments had been completely removed.  I irrigated out the renal pelvis and then perform pyeloscopy under fluoroscopic guidance to ensure there were no additional stone fragments.  I then slowly backed out the ureteroscope noting no ureteral trauma.  The UPJ was intact.  There were no mucosal abnormalities.  The wire was then backloaded through the cystoscope and a ureteral stent was advance over the wire  using Seldinger technique.  The stent was positioned appropriately under fluoroscopic and cystoscopic guidance.  The wire was then removed with an adequate stent curl noted in the renal pelvis as well as in the  bladder.  The bladder was then emptied and the procedure ended.  The patient appeared to tolerate the procedure well and without complications.  The patient was able to be awakened and transferred to the recovery unit in satisfactory condition.   Disposition: The tether of the stent was left on and secured to the ventral aspect of the patient's penis.  Instructions for removing the stent have been provided to the patient. The patient has been scheduled for followup in 6 weeks with a renal ultrasound.

## 2024-12-27 NOTE — H&P (Addendum)
 Matthew Bird  has presented today for surgery, with the diagnosis of LEFT KIDNEY STONE.  The various methods of treatment have been discussed with the patient and family. After consideration of risks, benefits and other options for treatment, the patient has consented to  Procedures with comments: CYSTOSCOPY/URETEROSCOPY/HOLMIUM LASER/STENT PLACEMENT (Left) - CYSTOSCOPY/LEFT URETEROSCOPY/HOLMIUM LASER/STENT PLACEMENT as a surgical intervention.  The patient's history has been reviewed, patient examined, no change in status, stable for surgery.  I have reviewed the patient's chart and labs.  Questions were answered to the patient's satisfaction.      History of present illness: Left sided ureteral stone, unable to get during first surgery because of tight UPJ.  Stent placed.  No changes to his medical record.  Review of systems: A 12 point comprehensive review of systems was obtained and is negative unless otherwise stated in the history of present illness.  Patient Active Problem List   Diagnosis Date Noted   Appendicitis, acute 08/21/2019   Acute appendicitis with localized peritonitis 08/21/2019    Medications Ordered Prior to Encounter[1]  Past Medical History:  Diagnosis Date   ADHD (attention deficit hyperactivity disorder)    no Meds   Cancer (HCC) 2025   melanoma on Back   Cluster headache syndrome    WHEN HAVING CLUSTER HEADACHES 1-3/DAY   Dysrhythmia 2018   SVTs  workup negative per pt   GERD (gastroesophageal reflux disease)    Headache    History of kidney stones    Tonsillar hypertrophy    Tonsillitis, chronic     Past Surgical History:  Procedure Laterality Date   CYSTOSCOPY/URETEROSCOPY/HOLMIUM LASER/STENT PLACEMENT Left 12/14/2024   Procedure: CYSTOSCOPY/URETEROSCOPY/HOLMIUM LASER/STENT PLACEMENT;  Surgeon: Cam Morene ORN, MD;  Location: [facility];  Service: Urology;  Laterality: Left;  CYSTOSCOPY/LEFT URETEROSCOPY/HOLMIUM LASER/STENT PLACEMENT    ESOPHAGOGASTRODUODENOSCOPY  2024   with biopsy   LAPAROSCOPIC APPENDECTOMY N/A 08/21/2019   Procedure: APPENDECTOMY LAPAROSCOPIC;  Surgeon: Rodolph Romano, MD;  Location: [facility];  Service: General;  Laterality: N/A;   TONSILLECTOMY AND ADENOIDECTOMY N/A 06/20/2015   Procedure: TONSILLECTOMY AND ADENOIDECTOMY;  Surgeon: Chinita Hasten, MD;  Location: [facility];  Service: ENT;  Laterality: N/A;    Social History[2]  History reviewed. No pertinent family history.  PE: Vitals:   12/27/24 0738  BP: 129/74  Pulse: 80  Resp: 16  Temp: 98.7 F (37.1 C)  TempSrc: Oral  SpO2: 100%  Weight: 73.5 kg  Height: 5' 10.5 (1.791 m)   Patient appears to be in no acute distress  patient is alert and oriented x3 Atraumatic normocephalic head No cervical or supraclavicular lymphadenopathy appreciated No increased work of breathing, no audible wheezes/rhonchi Regular sinus rhythm/rate Abdomen is soft, nontender, nondistended, no CVA or suprapubic tenderness Lower extremities are symmetric without appreciable edema Grossly neurologically intact No identifiable skin lesions  No results for input(s): WBC, HGB, HCT in the last 72 hours. No results for input(s): NA, K, CL, CO2, GLUCOSE, BUN, CREATININE, CALCIUM in the last 72 hours. No results for input(s): LABPT, INR in the last 72 hours. No results for input(s): LABURIN in the last 72 hours. Results for orders placed or performed in visit on 10/26/19  Novel Coronavirus, NAA ([facility])     Status: None   Collection Time: 10/26/19  1:49 PM   Specimen: Nasopharyngeal(NP) swabs in vial transport medium   NASOPHARYNGE  TESTING  Result Value Ref Range Status   SARS-CoV-2, NAA Not Detected Not Detected Final    Comment: This nucleic  acid amplification test was developed and its performance characteristics determined by [employer]. Nucleic acid amplification tests include PCR and TMA. This test  has not been FDA cleared or approved. This test has been authorized by FDA under an Emergency Use Authorization (EUA). This test is only authorized for the duration of time the declaration that circumstances exist justifying the authorization of the emergency use of in vitro diagnostic tests for detection of SARS-CoV-2 virus and/or diagnosis of COVID-19 infection under section 564(b)(1) of the Act, 21 U.S.C. 639aaa-6(a) (1), unless the authorization is terminated or revoked sooner. When diagnostic testing is negative, the possibility of a false negative result should be considered in the context of a patient's recent exposures and the presence of clinical signs and symptoms consistent with COVID-19. An individual without symptoms of COVID-19 and who is not shedding SARS-CoV-2 virus would  expect to have a negative (not detected) result in this assay.     Imaging: none  Imp: Left ureteral stone  Recommendations:   Plan for removal of stone, left ureteroscopy, laser lithotripsy and stent exchange.     Morene LELON Salines       [1]  No current facility-administered medications on file prior to encounter.   Current Outpatient Medications on File Prior to Encounter  Medication Sig Dispense Refill   ibuprofen (ADVIL) 200 MG tablet Take 600 mg by mouth 2 (two) times daily as needed (pain.).     omeprazole (PRILOSEC OTC) 20 MG tablet Take 20 mg by mouth daily before breakfast.     ondansetron  (ZOFRAN -ODT) 4 MG disintegrating tablet Take 1 tablet (4 mg total) by mouth every 8 (eight) hours as needed for nausea or vomiting. 20 tablet 0   tamsulosin  (FLOMAX ) 0.4 MG CAPS capsule Take 1 capsule (0.4 mg total) by mouth daily. (Patient taking differently: Take 0.4 mg by mouth every evening.) 7 capsule 0   traMADol  (ULTRAM ) 50 MG tablet Take 1-2 tablets (50-100 mg total) by mouth every 6 (six) hours as needed for moderate pain (pain score 4-6). 15 tablet 0  [2]  Social History Tobacco Use    Smoking status: Some Days    Types: Cigars, Cigarettes    Passive exposure: Never   Smokeless tobacco: Never  Vaping Use   Vaping status: Never Used  Substance Use Topics   Alcohol use: Yes    Comment: occasional   Drug use: No

## 2024-12-27 NOTE — Anesthesia Procedure Notes (Signed)
 Procedure Name: LMA Insertion Date/Time: 12/27/2024 9:37 AM  Performed by: Dasie Nena PARAS, CRNAPre-anesthesia Checklist: Patient identified, Emergency Drugs available, Suction available, Patient being monitored and Timeout performed Patient Re-evaluated:Patient Re-evaluated prior to induction Oxygen Delivery Method: Circle system utilized Preoxygenation: Pre-oxygenation with 100% oxygen Induction Type: IV induction Ventilation: Mask ventilation without difficulty LMA: LMA inserted LMA Size: 5.0 Number of attempts: 1 Placement Confirmation: positive ETCO2 and breath sounds checked- equal and bilateral Tube secured with: Tape Dental Injury: Teeth and Oropharynx as per pre-operative assessment

## 2024-12-27 NOTE — Anesthesia Preprocedure Evaluation (Signed)
"                                    Anesthesia Evaluation  Patient identified by MRN, date of birth, ID band Patient awake    Reviewed: Allergy & Precautions, NPO status , Patient's Chart, lab work & pertinent test results  Airway Mallampati: I  TM Distance: >3 FB Neck ROM: Full    Dental no notable dental hx. (+) Dental Advisory Given, Teeth Intact   Pulmonary neg pulmonary ROS, neg recent URI, Current Smoker   Pulmonary exam normal breath sounds clear to auscultation       Cardiovascular Normal cardiovascular exam+ dysrhythmias Supra Ventricular Tachycardia  Rhythm:Regular Rate:Normal     Neuro/Psych  Headaches PSYCHIATRIC DISORDERS         GI/Hepatic Neg liver ROS,GERD  Controlled and Medicated,,  Endo/Other  negative endocrine ROS    Renal/GU negative Renal ROS     Musculoskeletal negative musculoskeletal ROS (+)    Abdominal   Peds  Hematology negative hematology ROS (+)   Anesthesia Other Findings   Reproductive/Obstetrics                              Anesthesia Physical Anesthesia Plan  ASA: 1  Anesthesia Plan: General   Post-op Pain Management: Minimal or no pain anticipated   Induction: Intravenous  PONV Risk Score and Plan: 2 and Ondansetron , Dexamethasone  and Treatment may vary due to age or medical condition  Airway Management Planned: LMA  Additional Equipment:   Intra-op Plan:   Post-operative Plan: Extubation in OR  Informed Consent: I have reviewed the patients History and Physical, chart, labs and discussed the procedure including the risks, benefits and alternatives for the proposed anesthesia with the patient or authorized representative who has indicated his/her understanding and acceptance.     Dental advisory given  Plan Discussed with: CRNA  Anesthesia Plan Comments:          Anesthesia Quick Evaluation  "

## 2024-12-27 NOTE — Transfer of Care (Signed)
 Immediate Anesthesia Transfer of Care Note  Patient: Matthew Bird  Procedure(s) Performed: CYSTOSCOPY/URETEROSCOPY/HOLMIUM LASER/STENT PLACEMENT (Left: Ureter)  Patient Location: PACU  Anesthesia Type:General  Level of Consciousness: drowsy and patient cooperative  Airway & Oxygen Therapy: Patient Spontanous Breathing and Patient connected to face mask oxygen  Post-op Assessment: Report given to RN and Post -op Vital signs reviewed and stable  Post vital signs: Reviewed and stable  Last Vitals:  Vitals Value Taken Time  BP 133/66 12/27/24 10:58  Temp    Pulse 65 12/27/24 11:00  Resp 9 12/27/24 11:00  SpO2 100 % 12/27/24 11:00  Vitals shown include unfiled device data.  Last Pain:  Vitals:   12/27/24 0738  TempSrc: Oral  PainSc: 0-No pain      Patients Stated Pain Goal: 5 (12/27/24 9261)  Complications: No notable events documented.

## 2024-12-27 NOTE — Anesthesia Postprocedure Evaluation (Signed)
"   Anesthesia Post Note  Patient: Matthew Bird  Procedure(s) Performed: CYSTOSCOPY/URETEROSCOPY/HOLMIUM LASER/STENT PLACEMENT (Left: Ureter)     Patient location during evaluation: PACU Anesthesia Type: General Level of consciousness: sedated and patient cooperative Pain management: pain level controlled Vital Signs Assessment: post-procedure vital signs reviewed and stable Respiratory status: spontaneous breathing Cardiovascular status: stable Anesthetic complications: no   No notable events documented.  Last Vitals:  Vitals:   12/27/24 1115 12/27/24 1130  BP: 125/67 121/72  Pulse: 71 74  Resp: 13 16  Temp:    SpO2: 98% 98%    Last Pain:  Vitals:   12/27/24 1135  TempSrc:   PainSc: 5                  Yasmyn Bellisario      "

## 2024-12-28 ENCOUNTER — Encounter (HOSPITAL_COMMUNITY): Payer: Self-pay | Admitting: Urology

## 2025-01-11 LAB — STONE ANALYSIS
Calcium Oxalate Dihydrate: 70 %
Calcium Oxalate Monohydrate: 25 %
Calcium Phosphate (Hydroxyl): 5 %
Weight Calculi: 24 mg
